# Patient Record
Sex: Male | Born: 1960 | Race: Black or African American | Hispanic: No | Marital: Married | Smoking: Current every day smoker
Health system: Southern US, Community
[De-identification: ages and names within clinical notes are randomized; demographics above are authoritative.]

## PROBLEM LIST (undated history)

## (undated) DIAGNOSIS — I10 Essential (primary) hypertension: Secondary | ICD-10-CM

## (undated) DIAGNOSIS — F209 Schizophrenia, unspecified: Secondary | ICD-10-CM

## (undated) DIAGNOSIS — E119 Type 2 diabetes mellitus without complications: Secondary | ICD-10-CM

## (undated) DIAGNOSIS — F319 Bipolar disorder, unspecified: Secondary | ICD-10-CM

---

## 2005-04-30 ENCOUNTER — Inpatient Hospital Stay (HOSPITAL_COMMUNITY): Admission: EM | Admit: 2005-04-30 | Discharge: 2005-05-05 | Payer: Self-pay | Admitting: Psychiatry

## 2005-04-30 ENCOUNTER — Ambulatory Visit: Payer: Self-pay | Admitting: Psychiatry

## 2010-04-26 ENCOUNTER — Other Ambulatory Visit: Payer: Self-pay | Admitting: Emergency Medicine

## 2010-04-26 ENCOUNTER — Ambulatory Visit: Payer: Self-pay | Admitting: Psychiatry

## 2010-04-27 ENCOUNTER — Inpatient Hospital Stay (HOSPITAL_COMMUNITY): Admission: AD | Admit: 2010-04-27 | Discharge: 2010-04-30 | Payer: Self-pay | Admitting: Psychiatry

## 2010-11-11 LAB — GLUCOSE, CAPILLARY: Glucose-Capillary: 160 mg/dL — ABNORMAL HIGH (ref 70–99)

## 2010-11-12 LAB — RAPID URINE DRUG SCREEN, HOSP PERFORMED
Amphetamines: NOT DETECTED
Benzodiazepines: NOT DETECTED
Cocaine: NOT DETECTED
Tetrahydrocannabinol: NOT DETECTED

## 2010-11-12 LAB — HEPATIC FUNCTION PANEL
Alkaline Phosphatase: 64 U/L (ref 39–117)
Indirect Bilirubin: 0.3 mg/dL (ref 0.3–0.9)
Total Protein: 6.1 g/dL (ref 6.0–8.3)

## 2010-11-12 LAB — GLUCOSE, CAPILLARY
Glucose-Capillary: 143 mg/dL — ABNORMAL HIGH (ref 70–99)
Glucose-Capillary: 227 mg/dL — ABNORMAL HIGH (ref 70–99)

## 2010-11-12 LAB — CARBAMAZEPINE LEVEL, TOTAL: Carbamazepine Lvl: 2.6 ug/mL — ABNORMAL LOW (ref 4.0–12.0)

## 2010-11-12 LAB — CBC
HCT: 32.5 % — ABNORMAL LOW (ref 39.0–52.0)
MCHC: 33 g/dL (ref 30.0–36.0)
RDW: 13.9 % (ref 11.5–15.5)

## 2010-11-12 LAB — POCT I-STAT, CHEM 8
BUN: 13 mg/dL (ref 6–23)
Creatinine, Ser: 1.2 mg/dL (ref 0.4–1.5)
Glucose, Bld: 97 mg/dL (ref 70–99)
Hemoglobin: 11.6 g/dL — ABNORMAL LOW (ref 13.0–17.0)
Potassium: 3.3 mEq/L — ABNORMAL LOW (ref 3.5–5.1)

## 2011-01-14 NOTE — Discharge Summary (Signed)
NAME:  [name], [name]              ACCOUNT NO.:  [id number]   MEDICAL RECORD NO.:  [id number]          PATIENT TYPE:  IPS   LOCATION:  0404                          FACILITY:  [facility]   PHYSICIAN:  Jeanice Lim, M.D. DATE OF BIRTH:  1960/10/17   DATE OF ADMISSION:  04/30/2005  DATE OF DISCHARGE:  05/05/2005                                 DISCHARGE SUMMARY   IDENTIFYING DATA:  This is a 50 year old African-American male, single,  involuntarily committed, reporting I just can't take it any more.  I can't  sit on that porch any more.  The patient admitted to using cocaine, had  been living with father since discharge from the hospital in May, started on  Risperdal Consta.  Haldol Decanoate was discontinued.  No oral Risperdal  continued.  Arguing with father, using cocaine, questionable alcohol,  feeling agitated and unsafe, feeling like tearing things up.  History of  agitation and property destruction.  Admits to noncompliance with  medications.   ADMISSION MEDICATIONS:  Prinivil, Seroquel, Haldol, Glucophage.   ALLERGIES:  No known drug allergies.   PHYSICAL AND NEUROLOGICAL EXAMINATION:  Within normal limits.   ROUTINE ADMISSION LABS:  Within normal limits.   MENTAL STATUS EXAM:  Fully alert, irritable, anxious, psychomotor agitated,  pressured speech, some decreased production.  Mood was irritable, guarded,  thought process disorganized, paranoia about being around people.  Vague  homicidal ideation and suicidal ideation without plan.  Cognitively grossly  intact, except not today.  Unreliable historian.  Poor judgment and insight.   ADMISSION DIAGNOSES:  AXIS I:  Schizophrenia, paranoid type.  AXIS II:  Deferred.  AXIS III:  Diabetes mellitus type 2, onychomycosis, hypertension by history.  AXIS IV:  Severe problems with stress related to death of mother in 08/27/2004 and housing problems, wants a group home.  AXIS V:  25/57   HOSPITAL COURSE:  The patient was  admitted and ordered routine p.r.n.  medications.  The patient was added on oral Risperdal and Librium detox was  ordered for possible withdrawal symptoms.  Alcohol level was 15.  CIWA was  monitored as well as routine labs and other medication medications were  resumed.  Risk/benefit ratio and alternative treatments were discussed.  The  patient showed gradual improvement in symptoms as he was stabilized on  medications.  Case management assisted him with his aftercare needs.  He was  discharged in improved condition with no overt psychotic symptoms and no  dangerous ideation.  Medication education was given, including discussing  risk/benefit ratio and alternative treatments and importance of compliance  with medications.  He was discharged on:  1.  Actos 30 mg in the morning.  2.  Lantus insulin 12 units subcutaneous q.h.s.  3.  Cogentin 1 mg b.i.d.  4.  Prinivil 10 mg q.a.m.  5.  Glucophage XR 500 mg two in the morning and two at 5 p.m.  6.  Risperdal 2 mg 1/2 in the morning and 1.5 at 8 p.m.  7.  Glucotrol 5 mg 2 in the morning and 2 at 6 p.m.   DISPOSITION:  The patient was to follow up at [facility] mental health  Thursday September 7 at 11 a.m.   DISCHARGE DIAGNOSES:  AXIS I:  Schizophrenia, paranoid type.  AXIS II:  Deferred.  AXIS III:  Diabetes mellitus type 2, onychomycosis, hypertension by history.  AXIS IV:  Severe problems with stress related to death of mother in August 17, 2004 and housing problems, wants a group home.  AXIS V:  Global assessment of function on discharge was 50-55.      Jeanice Lim, M.D.  Electronically Signed     JEM/MEDQ  D:  06/19/2005  T:  06/19/2005  Job:  [id number]

## 2011-01-14 NOTE — Consult Note (Signed)
NAME:  [name], [name]              ACCOUNT NO.:  [id number]   MEDICAL RECORD NO.:  [id number]          PATIENT TYPE:  IPS   LOCATION:  0403                          [facility]ACILITY:  [facility]   PHYSICIAN:  Corinna L. Lendell Caprice, MDDATE OF BIRTH:  [birth year]   DATE OF CONSULTATION:  05/03/2005  DATE OF DISCHARGE:                                   CONSULTATION   REASON FOR CONSULTATION:  Uncontrolled type 2 diabetes.   IMPRESSION/RECOMMENDATION:  1.  Diabetes:  Will add Actos which can be titrated upward over a few weeks.      I will also increase the sliding scale Humalog and add Lantus 12 units.      Ideally, the patient would be on insulin or metformin, or insulin alone      as an outpatient.  However, I doubt patient would be compliant with      insulin.  He reports that he never checks his sugars at home and has no      Glucometer.  He does admit to being on insulin in the past but he      stopped it on his own because (he) was tired of it.  I doubt perfect      control with the addition of Actos, but it should help somewhat.  2.  Controlled hypertension.  3.  Schizophrenia.   HISTORY OF PRESENT ILLNESS:  [name] is a 50 year old black male who was  admitted for schizophrenia.  He has diabetes and apparently has been  noncompliant with his medications.  His sugars have been in the 300s to 400s  despite being on sliding scale insulin, max dose metformin and glipizide.  The patient has no complaints currently.   PAST MEDICAL HISTORY:  As above.   MEDICATIONS:  1.  Nicotine patch.  2.  Multivitamin.  3.  Thiamine.  4.  Librium 25 mg a day.  5.  Cogentin 1 mg p.o. b.i.d.  6.  Lisinopril 10 mg p.o. q.d.  7.  Metformin 1000 mg p.o. b.i.d.  8.  Risperidone 25 mg IM every 14 days.  9.  Insulin sliding scale Humalog.  10. Risperdal 3 mg p.o. q.h.s.  11. Glucotrol 10 mg p.o. b.i.d.   SOCIAL HISTORY:  The patient smokes two packs a cigarettes a day and  apparently may use cocaine per  chart, although it is difficult to read the  writing.   FAMILY HISTORY:  Noncontributory.   REVIEW OF SYSTEMS:  As above, otherwise negative.   PHYSICAL EXAMINATION:  VITAL SIGNS:  Temperature 97.6, pulse 100,  respiratory rate 20, blood pressure 132/83.  GENERAL:  The patient is an overweight black male lying on the bed in no  acute distress.  HEENT:  Normocephalic and atraumatic.  Pupils are equal, round and reactive  to light.  Sclera nonicteric.  NECK:  Supple.  No lymphadenopathy.  LUNGS:  Clear to auscultation bilaterally without wheezes, rhonchi or rales.  CARDIOVASCULAR:  Regular rate and rhythm without murmurs, gallops or rubs.  ABDOMEN:  Obese, soft, nontender, nondistended.  GU:  Deferred.  RECTAL:  Deferred.  EXTREMITIES:  No clubbing, cyanosis, or edema.  SKIN:  No rash or ulcerations.  PSYCHIATRIC:  The patient is calm and cooperative.  NEUROLOGIC:  Alert and oriented.  Cranial nerves and sensory motor exam are  grossly intact.   LABORATORY DATA:  Complete metabolic panel is significant for glucose of 312  and albumin of 2.9, total protein of 5.5; otherwise normal.  CBC  unremarkable.  CBGs have been ranging in the high 200s, up to 400.  EKG  shows normal sinus rhythm, poor R wave progression and nonspecific changes.  His hemoglobin A1C was 8.   ASSESSMENT/PLAN:  As above.  My partners will follow and make further  recommendations if needed.      Corinna L. Lendell Caprice, MD  Electronically Signed     CLS/MEDQ  D:  05/03/2005  T:  05/04/2005  Job:  [id number]

## 2011-08-25 ENCOUNTER — Emergency Department: Payer: Self-pay | Admitting: Emergency Medicine

## 2011-10-11 LAB — COMPREHENSIVE METABOLIC PANEL
Albumin: 3.1 g/dL — ABNORMAL LOW (ref 3.4–5.0)
Anion Gap: 11 (ref 7–16)
Chloride: 105 mmol/L (ref 98–107)
Co2: 26 mmol/L (ref 21–32)
EGFR (African American): >60
EGFR (Non-African Amer.): 56 — ABNORMAL LOW
Glucose: 278 mg/dL — ABNORMAL HIGH (ref 65–99)
Osmolality: 293 (ref 275–301)
Potassium: 3.5 mmol/L (ref 3.5–5.1)
SGOT(AST): 23 U/L (ref 15–37)
SGPT (ALT): 25 U/L
Total Protein: 7.8 g/dL (ref 6.4–8.2)

## 2011-10-11 LAB — CBC
HGB: 13.4 g/dL (ref 13.0–18.0)
MCH: 28.9 pg (ref 26.0–34.0)
MCHC: 33.5 g/dL (ref 32.0–36.0)
MCV: 86 fL (ref 80–100)
Platelet: 226 10*3/uL (ref 150–440)
RBC: 4.65 10*6/uL (ref 4.40–5.90)
RDW: 13.8 % (ref 11.5–14.5)

## 2011-10-11 LAB — DRUG SCREEN, URINE
Barbiturates, Ur Screen: NEGATIVE (ref ?–200)
Cannabinoid 50 Ng, Ur ~~LOC~~: NEGATIVE (ref ?–50)
Cocaine Metabolite,Ur ~~LOC~~: NEGATIVE (ref ?–300)
Methadone, Ur Screen: NEGATIVE (ref ?–300)

## 2011-10-11 LAB — ETHANOL: Ethanol: <3 mg/dL

## 2011-10-11 LAB — TSH: Thyroid Stimulating Horm: 1.21 u[IU]/mL

## 2011-10-12 ENCOUNTER — Inpatient Hospital Stay: Payer: Self-pay | Admitting: Psychiatry

## 2011-10-13 LAB — URINALYSIS, COMPLETE
Bilirubin,UR: NEGATIVE
Ketone: NEGATIVE
Leukocyte Esterase: NEGATIVE
Protein: 100
RBC,UR: NONE SEEN /HPF (ref 0–5)
Specific Gravity: 1 (ref 1.003–1.030)
Squamous Epithelial: NONE SEEN
WBC UR: <1 /HPF (ref 0–5)

## 2011-10-13 LAB — BEHAVIORAL MEDICINE 1 PANEL
Albumin: 2.9 g/dL — ABNORMAL LOW (ref 3.4–5.0)
Alkaline Phosphatase: 55 U/L (ref 50–136)
Anion Gap: 10 (ref 7–16)
BUN: 10 mg/dL (ref 7–18)
Basophil #: 0 10*3/uL (ref 0.0–0.1)
Calcium, Total: 8.4 mg/dL — ABNORMAL LOW (ref 8.5–10.1)
Chloride: 96 mmol/L — ABNORMAL LOW (ref 98–107)
Creatinine: 1.43 mg/dL — ABNORMAL HIGH (ref 0.60–1.30)
Eosinophil %: 2.9 %
Glucose: 190 mg/dL — ABNORMAL HIGH (ref 65–99)
HCT: 36.6 % — ABNORMAL LOW (ref 40.0–52.0)
HGB: 12.3 g/dL — ABNORMAL LOW (ref 13.0–18.0)
Lymphocyte #: 1.1 10*3/uL (ref 1.0–3.6)
Lymphocyte %: 15.2 %
MCV: 85 fL (ref 80–100)
Monocyte %: 11.5 %
Osmolality: 261 (ref 275–301)
Potassium: 3.7 mmol/L (ref 3.5–5.1)
RBC: 4.29 10*6/uL — ABNORMAL LOW (ref 4.40–5.90)
Sodium: 128 mmol/L — ABNORMAL LOW (ref 136–145)
Thyroid Stimulating Horm: 1.01 u[IU]/mL
Total Protein: 6.9 g/dL (ref 6.4–8.2)
WBC: 7.2 10*3/uL (ref 3.8–10.6)

## 2011-10-14 LAB — COMPREHENSIVE METABOLIC PANEL
Albumin: 2.6 g/dL — ABNORMAL LOW (ref 3.4–5.0)
Alkaline Phosphatase: 55 U/L (ref 50–136)
Anion Gap: 6 — ABNORMAL LOW (ref 7–16)
Bilirubin,Total: 0.5 mg/dL (ref 0.2–1.0)
Calcium, Total: 8.7 mg/dL (ref 8.5–10.1)
Chloride: 106 mmol/L (ref 98–107)
Creatinine: 1.46 mg/dL — ABNORMAL HIGH (ref 0.60–1.30)
Glucose: 216 mg/dL — ABNORMAL HIGH (ref 65–99)
Osmolality: 281 (ref 275–301)
Potassium: 4 mmol/L (ref 3.5–5.1)
Sodium: 138 mmol/L (ref 136–145)

## 2011-11-05 ENCOUNTER — Emergency Department: Payer: Self-pay | Admitting: Emergency Medicine

## 2011-11-05 LAB — SALICYLATE LEVEL: Salicylates, Serum: 2 mg/dL

## 2011-11-05 LAB — ETHANOL
Ethanol %: <0.003 % (ref 0.000–0.080)
Ethanol: <3 mg/dL

## 2011-11-05 LAB — URINALYSIS, COMPLETE
Ph: 5 (ref 4.5–8.0)
Protein: 100
RBC,UR: 1 /HPF (ref 0–5)

## 2011-11-05 LAB — COMPREHENSIVE METABOLIC PANEL
Albumin: 3.6 g/dL (ref 3.4–5.0)
BUN: 12 mg/dL (ref 7–18)
Bilirubin,Total: 1 mg/dL (ref 0.2–1.0)
Chloride: 105 mmol/L (ref 98–107)
Co2: 24 mmol/L (ref 21–32)
Creatinine: 1.38 mg/dL — ABNORMAL HIGH (ref 0.60–1.30)
EGFR (Non-African Amer.): 58 — ABNORMAL LOW
Osmolality: 280 (ref 275–301)
Potassium: 3.5 mmol/L (ref 3.5–5.1)
SGOT(AST): 140 U/L — ABNORMAL HIGH (ref 15–37)
Sodium: 140 mmol/L (ref 136–145)
Total Protein: 7.5 g/dL (ref 6.4–8.2)

## 2011-11-05 LAB — CBC
HCT: 36.4 % — ABNORMAL LOW (ref 40.0–52.0)
MCH: 29.6 pg (ref 26.0–34.0)
MCV: 87 fL (ref 80–100)
RDW: 13.9 % (ref 11.5–14.5)
WBC: 8.9 10*3/uL (ref 3.8–10.6)

## 2011-11-05 LAB — DRUG SCREEN, URINE
Barbiturates, Ur Screen: NEGATIVE (ref ?–200)
Benzodiazepine, Ur Scrn: NEGATIVE (ref ?–200)
Cannabinoid 50 Ng, Ur ~~LOC~~: NEGATIVE (ref ?–50)
Methadone, Ur Screen: NEGATIVE (ref ?–300)
Opiate, Ur Screen: NEGATIVE (ref ?–300)
Tricyclic, Ur Screen: NEGATIVE (ref ?–1000)

## 2011-11-05 LAB — ACETAMINOPHEN LEVEL: Acetaminophen: <2 ug/mL

## 2011-11-14 LAB — VALPROIC ACID LEVEL: Valproic Acid: 67 ug/mL

## 2014-06-19 ENCOUNTER — Emergency Department (HOSPITAL_COMMUNITY)
Admission: EM | Admit: 2014-06-19 | Discharge: 2014-06-21 | Disposition: A | Discharge status: Home / Self Care | Payer: Medicare Other | Attending: Emergency Medicine | Admitting: Emergency Medicine

## 2014-06-19 ENCOUNTER — Encounter (HOSPITAL_COMMUNITY): Payer: Self-pay | Admitting: Emergency Medicine

## 2014-06-19 DIAGNOSIS — F259 Schizoaffective disorder, unspecified: Secondary | ICD-10-CM | POA: Diagnosis not present

## 2014-06-19 DIAGNOSIS — Z046 Encounter for general psychiatric examination, requested by authority: Secondary | ICD-10-CM | POA: Diagnosis present

## 2014-06-19 DIAGNOSIS — N289 Disorder of kidney and ureter, unspecified: Secondary | ICD-10-CM | POA: Diagnosis not present

## 2014-06-19 DIAGNOSIS — I10 Essential (primary) hypertension: Secondary | ICD-10-CM | POA: Diagnosis not present

## 2014-06-19 HISTORY — DX: Essential (primary) hypertension: I10

## 2014-06-19 HISTORY — DX: Schizophrenia, unspecified: F20.9

## 2014-06-19 HISTORY — DX: Bipolar disorder, unspecified: F31.9

## 2014-06-19 NOTE — ED Notes (Signed)
Writer proceeded to draw blood work but per [employer] pt sts "he does not like any females touching him".

## 2014-06-19 NOTE — ED Notes (Signed)
Patient escorted to room by [employer] in handcuffs. Verbally aggressive to all staff and [employer]. Patient able to speak full, clear sentences with profuse profanity. Denies pain. Moving all extremities equally. All belongings at nurses' station and wanded by security. [employer] maintains at bedside. No other questions/concerns by patient.

## 2014-06-19 NOTE — ED Provider Notes (Signed)
CSN: [id number]     Arrival date & time 06/19/14  2150 History   First MD Initiated Contact with Patient 06/19/14 2246     Chief Complaint  Patient presents with  . IVC      (Consider location/radiation/quality/duration/timing/severity/associated sxs/prior Treatment) HPI Comments: Patient was brought in by police for IVC. Very limited history is present at this time however patient has a history of schizophrenia and today had a violent episode where he destroyed property where he lives. When asking questions here unable to understand what pt is saying.  The history is provided by the police. The history is limited by the absence of a caregiver and the condition of the patient.    Past Medical History  Diagnosis Date  . Bipolar 1 disorder   . Schizophrenia   . Hypertension    History reviewed. No pertinent past surgical history. History reviewed. No pertinent family history. History  Substance Use Topics  . Smoking status: Never Smoker   . Smokeless tobacco: Not on file  . Alcohol Use: No    Review of Systems  Unable to perform ROS     Allergies  Review of patient's allergies indicates no known allergies.  Home Medications   Prior to Admission medications   Not on File   BP 173/88  Pulse 92  Temp(Src) 98.2 F (36.8 C) (Oral)  Resp 18  SpO2 97% Physical Exam  Nursing note and vitals reviewed. Constitutional: He is oriented to person, place, and time. He appears well-developed and well-nourished. No distress.  Pt laying on the stretcher cuffed to the bed  HENT:  Head: Normocephalic and atraumatic.  Mouth/Throat: Oropharynx is clear and moist.  Eyes: Conjunctivae and EOM are normal. Pupils are equal, round, and reactive to light.  Neck: Normal range of motion. Neck supple.  Cardiovascular: Normal rate.   Pulmonary/Chest: Effort normal.  Musculoskeletal: Normal range of motion. He exhibits no edema and no tenderness.  Neurological: He is alert and oriented to  person, place, and time.  Skin: Skin is warm and dry. No rash noted. No erythema.  Psychiatric:  Unable to hear what pt is saying.  Unclear if he is hallucinating    ED Course  Procedures (including critical care time) Labs Review Labs Reviewed  CBG MONITORING, ED    Imaging Review No results found.   EKG Interpretation None      MDM   Final diagnoses:  None    Patient is mumbling incoherently and based on police report he has a history of schizophrenia and is having a psychotic episode where he destroyed property where he lives. When I try to speed up the patient I cannot understand what he is saying when I ask if I can examine the patient he says no one is touching him or examining his heart. Patient's vital signs are stable.  Medical screening labs pending   Gwyneth SproutWhitney Kalecia Hartney, MD 06/19/14 [phone]

## 2014-06-20 DIAGNOSIS — F259 Schizoaffective disorder, unspecified: Secondary | ICD-10-CM | POA: Diagnosis present

## 2014-06-20 LAB — CBC WITH DIFFERENTIAL/PLATELET
Basophils Absolute: 0.1 10*3/uL (ref 0.0–0.1)
Basophils Relative: 1 % (ref 0–1)
EOS ABS: 0.2 10*3/uL (ref 0.0–0.7)
EOS PCT: 3 % (ref 0–5)
HEMATOCRIT: 30.7 % — AB (ref 39.0–52.0)
HEMOGLOBIN: 10.2 g/dL — AB (ref 13.0–17.0)
LYMPHS ABS: 1.6 10*3/uL (ref 0.7–4.0)
Lymphocytes Relative: 25 % (ref 12–46)
MCH: 26.2 pg (ref 26.0–34.0)
MCHC: 33.2 g/dL (ref 30.0–36.0)
MCV: 78.7 fL (ref 78.0–100.0)
MONO ABS: 0.8 10*3/uL (ref 0.1–1.0)
MONOS PCT: 12 % (ref 3–12)
Neutro Abs: 3.9 10*3/uL (ref 1.7–7.7)
Neutrophils Relative %: 59 % (ref 43–77)
Platelets: 190 10*3/uL (ref 150–400)
RBC: 3.9 MIL/uL — AB (ref 4.22–5.81)
RDW: 13.3 % (ref 11.5–15.5)
WBC: 6.6 10*3/uL (ref 4.0–10.5)

## 2014-06-20 LAB — I-STAT CHEM 8, ED
BUN: 19 mg/dL (ref 6–23)
CREATININE: 2.9 mg/dL — AB (ref 0.50–1.35)
Calcium, Ion: 1.12 mmol/L (ref 1.12–1.23)
Chloride: 107 mEq/L (ref 96–112)
GLUCOSE: 137 mg/dL — AB (ref 70–99)
HCT: 33 % — ABNORMAL LOW (ref 39.0–52.0)
Hemoglobin: 11.2 g/dL — ABNORMAL LOW (ref 13.0–17.0)
POTASSIUM: 3.3 meq/L — AB (ref 3.7–5.3)
SODIUM: 139 meq/L (ref 137–147)
TCO2: 20 mmol/L (ref 0–100)

## 2014-06-20 LAB — SALICYLATE LEVEL

## 2014-06-20 LAB — ETHANOL: Alcohol, Ethyl (B): <11 mg/dL (ref 0–11)

## 2014-06-20 LAB — ACETAMINOPHEN LEVEL

## 2014-06-20 MED ORDER — HYDROCHLOROTHIAZIDE 12.5 MG PO CAPS
25.0000 mg | ORAL_CAPSULE | Freq: Every day | ORAL | Status: DC
  Filled 2014-06-20: qty 2

## 2014-06-20 MED ORDER — POTASSIUM CHLORIDE CRYS ER 20 MEQ PO TBCR
10.0000 meq | EXTENDED_RELEASE_TABLET | Freq: Every day | ORAL | Status: DC
  Administered 2014-06-20: 10 meq via ORAL
  Filled 2014-06-20: qty 0.5

## 2014-06-20 MED ORDER — QUETIAPINE FUMARATE 100 MG PO TABS
100.0000 mg | ORAL_TABLET | Freq: Every day | ORAL | Status: DC
  Administered 2014-06-20: 100 mg via ORAL
  Filled 2014-06-20: qty 1

## 2014-06-20 MED ORDER — ZIPRASIDONE MESYLATE 20 MG IM SOLR
20.0000 mg | Freq: Once | INTRAMUSCULAR | Status: AC
  Administered 2014-06-20: 20 mg via INTRAMUSCULAR
  Filled 2014-06-20: qty 20

## 2014-06-20 MED ORDER — HYDROCHLOROTHIAZIDE 25 MG PO TABS
25.0000 mg | ORAL_TABLET | Freq: Every day | ORAL | Status: DC
  Administered 2014-06-20 – 2014-06-21 (×2): 25 mg via ORAL
  Filled 2014-06-20 (×2): qty 1

## 2014-06-20 MED ORDER — LORAZEPAM 2 MG/ML IJ SOLN
2.0000 mg | Freq: Once | INTRAMUSCULAR | Status: AC
  Administered 2014-06-20: 2 mg via INTRAMUSCULAR
  Filled 2014-06-20: qty 1

## 2014-06-20 MED ORDER — MAGNESIUM CITRATE PO SOLN
1.0000 | Freq: Once | ORAL | Status: AC
  Administered 2014-06-20: 1 via ORAL
  Filled 2014-06-20: qty 296

## 2014-06-20 MED ORDER — QUETIAPINE FUMARATE 50 MG PO TABS
50.0000 mg | ORAL_TABLET | Freq: Three times a day (TID) | ORAL | Status: DC
  Administered 2014-06-20 – 2014-06-21 (×4): 50 mg via ORAL
  Filled 2014-06-20 (×5): qty 1

## 2014-06-20 MED ORDER — DIPHENHYDRAMINE HCL 50 MG/ML IJ SOLN
50.0000 mg | Freq: Once | INTRAMUSCULAR | Status: AC
  Administered 2014-06-20: 50 mg via INTRAMUSCULAR
  Filled 2014-06-20: qty 1

## 2014-06-20 NOTE — ED Notes (Signed)
[facility] [employer], presents for medical clearance.  Pt IVC ed by caregiver.  Pt physically and verbally aggressive toward other in home.  Pt is hearing voices and responding to them.  Pt cursing obscenities at [facility] ED staff.  Denies SI, pt poor historian, unable to obtain history from pt.

## 2014-06-20 NOTE — Consult Note (Signed)
[facility]   Reason for Consult:  Psychosis  Referring Physician:  EDP  [name] is an 53 y.o. male. Total Time spent with patient: 45 minutes  Assessment: AXIS I:  Schizoaffective Disorder AXIS II:  Deferred AXIS III:   Past Medical History  Diagnosis Date  . Bipolar 1 disorder   . Schizophrenia   . Hypertension    AXIS IV:  other psychosocial or environmental problems, problems related to social environment and problems with primary support group AXIS V:  21-30 behavior considerably influenced by delusions or hallucinations OR serious impairment in judgment, communication OR inability to function in almost all areas  Plan:  Recommend psychiatric Inpatient admission when medically cleared.  Subjective:   [name] is a 53 y.o. male patient admitted with psychosis.  HPI:  Patient came to the ED from his group home after hallucinating and being aggressive.  He was cursing to the ED staff and threatening last night on arrival, PRN medications given.  Today, he is clearly responding to internal stimuli but denies it.  Irritable on exam and forwards little information, three attempts to try and get more information.  His group home was called but no return calls.  Unsure of his medications. HPI Elements:   Location:  generalized acute. Quality:  acute. Severity:  severe. Timing:  constant. Duration:  few days. Context:  stressors.  Past Psychiatric History: Past Medical History  Diagnosis Date  . Bipolar 1 disorder   . Schizophrenia   . Hypertension     reports that he has never smoked. He does not have any smokeless tobacco history on file. He reports that he does not drink alcohol. His drug history is not on file. History reviewed. No pertinent family history.         Allergies:  No Known Allergies  ACT Assessment Complete:  Yes:    Educational Status    Risk to Self: Risk to self with the past 6 months Is patient at risk for  suicide?: No Substance abuse history and/or treatment for substance abuse?: No  Risk to Others:    Abuse:    Prior Inpatient Therapy:    Prior Outpatient Therapy:    Additional Information:                    Objective: Blood pressure 152/103, pulse 100, temperature 98.2 F (36.8 C), temperature source Oral, resp. rate 20, SpO2 100.00%.There is no height or weight on file to calculate BMI. Results for orders placed during the hospital encounter of 06/19/14 (from the past 72 hour(s))  CBC WITH DIFFERENTIAL     Status: Abnormal   Collection Time    06/19/14 11:50 PM      Result Value Ref Range   WBC 6.6  4.0 - 10.5 K/uL   RBC 3.90 (*) 4.22 - 5.81 MIL/uL   Hemoglobin 10.2 (*) 13.0 - 17.0 g/dL   HCT 16.130.7 (*) 09.639.0 - 04.552.0 %   MCV 78.7  78.0 - 100.0 fL   MCH 26.2  26.0 - 34.0 pg   MCHC 33.2  30.0 - 36.0 g/dL   RDW 40.913.3  81.111.5 - 91.415.5 %   Platelets 190  150 - 400 K/uL   Neutrophils Relative % 59  43 - 77 %   Neutro Abs 3.9  1.7 - 7.7 K/uL   Lymphocytes Relative 25  12 - 46 %   Lymphs Abs 1.6  0.7 - 4.0 K/uL   Monocytes Relative  12  3 - 12 %   Monocytes Absolute 0.8  0.1 - 1.0 K/uL   Eosinophils Relative 3  0 - 5 %   Eosinophils Absolute 0.2  0.0 - 0.7 K/uL   Basophils Relative 1  0 - 1 %   Basophils Absolute 0.1  0.0 - 0.1 K/uL  ETHANOL     Status: None   Collection Time    06/19/14 11:50 PM      Result Value Ref Range   Alcohol, Ethyl (B) <11  0 - 11 mg/dL   Comment:            LOWEST DETECTABLE LIMIT FOR     SERUM ALCOHOL IS 11 mg/dL     FOR MEDICAL PURPOSES ONLY  ACETAMINOPHEN LEVEL     Status: None   Collection Time    06/19/14 11:50 PM      Result Value Ref Range   Acetaminophen (Tylenol), Serum <15.0  10 - 30 ug/mL   Comment:            THERAPEUTIC CONCENTRATIONS VARY     SIGNIFICANTLY. A RANGE OF 10-30     ug/mL MAY BE AN EFFECTIVE     CONCENTRATION FOR MANY PATIENTS.     HOWEVER, SOME ARE BEST TREATED     AT CONCENTRATIONS OUTSIDE THIS     RANGE.      ACETAMINOPHEN CONCENTRATIONS     >150 ug/mL AT 4 HOURS AFTER     INGESTION AND >50 ug/mL AT 12     HOURS AFTER INGESTION ARE     OFTEN ASSOCIATED WITH TOXIC     REACTIONS.  SALICYLATE LEVEL     Status: Abnormal   Collection Time    06/19/14 11:50 PM      Result Value Ref Range   Salicylate Lvl <2.0 (*) 2.8 - 20.0 mg/dL  I-STAT CHEM 8, ED     Status: Abnormal   Collection Time    06/20/14 12:00 AM      Result Value Ref Range   Sodium 139  137 - 147 mEq/L   Potassium 3.3 (*) 3.7 - 5.3 mEq/L   Chloride 107  96 - 112 mEq/L   BUN 19  6 - 23 mg/dL   Creatinine, Ser 2.132.90 (*) 0.50 - 1.35 mg/dL   Glucose, Bld 086137 (*) 70 - 99 mg/dL   Calcium, Ion 5.781.12  4.691.12 - 1.23 mmol/L   TCO2 20  0 - 100 mmol/L   Hemoglobin 11.2 (*) 13.0 - 17.0 g/dL   HCT 62.933.0 (*) 52.839.0 - 41.352.0 %   Labs are reviewed and are pertinent for no medical issues noted.  Current Facility-Administered Medications  Medication Dose Route Frequency Provider Last Rate Last Dose  . hydrochlorothiazide (HYDRODIURIL) tablet 25 mg  25 mg Oral Daily [name], NP   25 mg at 06/20/14 1403  . potassium chloride SA (K-DUR,KLOR-CON) CR tablet 10 mEq  10 mEq Oral Daily [name], NP   10 mEq at 06/20/14 1403   No current outpatient prescriptions on file.    Psychiatric Specialty Exam:     Blood pressure 152/103, pulse 100, temperature 98.2 F (36.8 C), temperature source Oral, resp. rate 20, SpO2 100.00%.There is no height or weight on file to calculate BMI.  General Appearance: Casual  Eye Contact::  Good  Speech:  Normal Rate  Volume:  Normal  Mood:  Angry and Irritable  Affect:  Flat  Thought Process:  Coherent  Orientation:  Full (Time,  Place, and Person)  Thought Content:  Hallucinations: Auditory Visual  Suicidal Thoughts:  No  Homicidal Thoughts:  No  Memory:  Immediate;   Fair Recent;   Fair Remote;   Fair  Judgement:  Impaired  Insight:  Lacking  Psychomotor Activity:  Normal  Concentration:  Fair   Recall:  [employer] of Knowledge:Fair  Language: Fair  Akathisia:  No  Handed:  Right  AIMS (if indicated):     Assets:  Health and safety inspector Housing Leisure Time Physical Health Resilience  Sleep:      Musculoskeletal: Strength & Muscle Tone: within normal limits Gait & Station: normal Patient leans: N/A  Treatment Plan Summary: Daily contact with patient to assess and evaluate symptoms and progress in treatment Medication management; antipsychotic medication started, admit to inpatient psychiatric unit for stabilization.  Nanine Means, PMH-NP 06/20/2014 5:29 PM  Patient seen, evaluated and I agree with notes by Nurse Practitioner. Thedore Mins, MD

## 2014-06-20 NOTE — ED Notes (Signed)
[facility]. Pt. Alert and oriented in no distress denies SI, HI, AVH and pain. Will continue to monitor for safety. Pt. Instructed to come to me with problems or concerns. Q 15 minute checks continue.

## 2014-06-20 NOTE — ED Notes (Signed)
Pt mumbling, incoherent. " There's stuff in the backyard, I don't know." Pt c/o of constipation and requested prune juice. Pt was inc of urine. Showered with encouragement.

## 2014-06-20 NOTE — ED Notes (Signed)
Pt is hearing voices, appears preoccupied. Up at desk frequently, speech is unclear and garb. In and out of bathroom c/o constipation . Citrtae of mag given

## 2014-06-20 NOTE — ED Notes (Signed)
Report to BeninLatricia, Charity fundraiserN.  [facility]

## 2014-06-20 NOTE — Progress Notes (Signed)
  CARE MANAGEMENT ED NOTE 06/20/2014  Patient:  [name],[name]   Account Number:  [id number]  Date Initiated:  06/20/2014  Documentation initiated by:  Edd ArbourGIBBS,KIMBERLY  Subjective/Objective Assessment:   [age] y rold old medicare pt [place] resident came in with [employer] in handcuffs. Verbally aggressive to all staff and [employer]. Patient able to speak full, clear sentences with profuse profanity. Denies pain. Moving all extremities equally.     Subjective/Objective Assessment Detail:   poor historian  Unable to assess pcp at this time  [name] not found as a provider in EPIC     Action/Plan:   noted no pcp listed spoke with pt who mumbled incoherent Reports "[name]" "[name]" and then stated incoherent information   Action/Plan Detail:   Anticipated DC Date:       Status Recommendation to Physician:   Result of Recommendation:    Other ED Services  Consult Working Plan    DC Planning Services  Other  Outpatient Services - Pt will follow up  PCP issues    Choice offered to / List presented to:            Status of service:  Completed, signed off  ED Comments:   ED Comments Detail:

## 2014-06-20 NOTE — ED Notes (Signed)
Pt moved from soiled bed to floor.  Pt incontinent of urine, sonorous respirations, responsive to loud verbal stimuli and sternal rub.  Skin color good, Security and [employer] at bedside to assist with placing pt back on stretcher.  Comfort measures given, HOB elevated, siderails up. Vital signs-WNL.

## 2014-06-21 ENCOUNTER — Encounter (HOSPITAL_COMMUNITY): Payer: Self-pay | Admitting: Psychiatry

## 2014-06-21 DIAGNOSIS — F259 Schizoaffective disorder, unspecified: Secondary | ICD-10-CM | POA: Diagnosis not present

## 2014-06-21 LAB — RAPID URINE DRUG SCREEN, HOSP PERFORMED
AMPHETAMINES: NOT DETECTED
BARBITURATES: NOT DETECTED
Benzodiazepines: NOT DETECTED
Cocaine: NOT DETECTED
OPIATES: NOT DETECTED
TETRAHYDROCANNABINOL: NOT DETECTED

## 2014-06-21 LAB — I-STAT CHEM 8, ED
BUN: 22 mg/dL (ref 6–23)
CREATININE: 2.5 mg/dL — AB (ref 0.50–1.35)
Calcium, Ion: 1.05 mmol/L — ABNORMAL LOW (ref 1.12–1.23)
Chloride: 102 mEq/L (ref 96–112)
Glucose, Bld: 268 mg/dL — ABNORMAL HIGH (ref 70–99)
HCT: 32 % — ABNORMAL LOW (ref 39.0–52.0)
Hemoglobin: 10.9 g/dL — ABNORMAL LOW (ref 13.0–17.0)
Potassium: 4.2 mEq/L (ref 3.7–5.3)
Sodium: 136 mEq/L — ABNORMAL LOW (ref 137–147)
TCO2: 21 mmol/L (ref 0–100)

## 2014-06-21 LAB — CBG MONITORING, ED
Glucose-Capillary: 135 mg/dL — ABNORMAL HIGH (ref 70–99)
Glucose-Capillary: 249 mg/dL — ABNORMAL HIGH (ref 70–99)
Glucose-Capillary: 234 mg/dL — ABNORMAL HIGH (ref 70–99)

## 2014-06-21 MED ORDER — INSULIN ASPART 100 UNIT/ML ~~LOC~~ SOLN
10.0000 [IU] | Freq: Once | SUBCUTANEOUS | Status: AC
  Administered 2014-06-21: 10 [IU] via SUBCUTANEOUS
  Filled 2014-06-21: qty 1

## 2014-06-21 MED ORDER — POTASSIUM CHLORIDE CRYS ER 10 MEQ PO TBCR: 10.0000 meq | EXTENDED_RELEASE_TABLET | Freq: Every day | ORAL | Status: DC

## 2014-06-21 MED ORDER — HYDROCHLOROTHIAZIDE 25 MG PO TABS: 25.0000 mg | ORAL_TABLET | Freq: Every day | ORAL | Status: DC

## 2014-06-21 MED ORDER — ACETAMINOPHEN 325 MG PO TABS
650.0000 mg | ORAL_TABLET | Freq: Four times a day (QID) | ORAL | Status: DC | PRN
  Administered 2014-06-21: 650 mg via ORAL
  Filled 2014-06-21: qty 2

## 2014-06-21 MED ORDER — INSULIN ASPART 100 UNIT/ML ~~LOC~~ SOLN
8.0000 [IU] | Freq: Once | SUBCUTANEOUS | Status: AC
  Administered 2014-06-21: 8 [IU] via SUBCUTANEOUS
  Filled 2014-06-21: qty 1

## 2014-06-21 MED ORDER — QUETIAPINE FUMARATE 100 MG PO TABS: 100.0000 mg | ORAL_TABLET | Freq: Every day | ORAL | Status: DC

## 2014-06-21 MED ORDER — LISINOPRIL 20 MG PO TABS
20.0000 mg | ORAL_TABLET | Freq: Every day | ORAL | Status: DC
  Administered 2014-06-21: 20 mg via ORAL
  Filled 2014-06-21: qty 1

## 2014-06-21 MED ORDER — QUETIAPINE FUMARATE 50 MG PO TABS: 50.0000 mg | ORAL_TABLET | Freq: Three times a day (TID) | ORAL | Status: DC

## 2014-06-21 MED ORDER — LORAZEPAM 1 MG PO TABS: 1.0000 mg | ORAL_TABLET | Freq: Four times a day (QID) | ORAL | Status: DC | PRN

## 2014-06-21 MED ORDER — ACETAMINOPHEN 325 MG PO TABS: 650.0000 mg | ORAL_TABLET | Freq: Once | ORAL | Status: DC

## 2014-06-21 MED ORDER — LISINOPRIL 40 MG PO TABS: 40.0000 mg | ORAL_TABLET | Freq: Every day | ORAL | Status: DC

## 2014-06-21 NOTE — ED Notes (Signed)
Pt sleeping in activity room, nad.  Pt is aware that he will be dc'd home later today.

## 2014-06-21 NOTE — ED Notes (Signed)
Sandwich and soda given.

## 2014-06-21 NOTE — Discharge Instructions (Signed)
Your creatinine (a measure of your kidney function) was elevated and needs to be rechecked by a primary care doctor.   Schizophrenia Schizophrenia is a mental illness. It may cause disturbed or disorganized thinking, speech, or behavior. People with schizophrenia have problems functioning in one or more areas of life: work, school, home, or relationships. People with schizophrenia are at increased risk for suicide, certain chronic physical illnesses, and unhealthy behaviors, such as smoking and drug use. People who have family members with schizophrenia are at higher risk of developing the illness. Schizophrenia affects men and women equally but usually appears at an earlier age (teenage or early adult years) in men.  SYMPTOMS The earliest symptoms are often subtle (prodrome) and may go unnoticed until the illness becomes more severe (first-break psychosis). Symptoms of schizophrenia may be continuous or may come and go in severity. Episodes often are triggered by major life events, such as family stress, college, [employer] service, marriage, pregnancy or child birth, divorce, or loss of a loved one. People with schizophrenia may see, hear, or feel things that do not exist (hallucinations). They may have false beliefs in spite of obvious proof to the contrary (delusions). Sometimes speech is incoherent or behavior is odd or withdrawn.  DIAGNOSIS Schizophrenia is diagnosed through an assessment by your caregiver. Your caregiver will ask questions about your thoughts, behavior, mood, and ability to function in daily life. Your caregiver may ask questions about your medical history and use of alcohol or drugs, including prescription medication. Your caregiver may also order blood tests and imaging exams. Certain medical conditions and substances can cause symptoms that resemble schizophrenia. Your caregiver may refer you to a mental health specialist for evaluation. There are three major criterion for a  diagnosis of schizophrenia:  Two or more of the following five symptoms are present for a month or longer:  Delusions. Often the delusions are that you are being attacked, harassed, cheated, persecuted or conspired against (persecutory delusions).  Hallucinations.   Disorganized speech that does not make sense to others.  Grossly disorganized (confused or unfocused) behavior or extremely overactive or underactive motor activity (catatonia).  Negative symptoms such as bland or blunted emotions (flat affect), loss of will power (avolition), and withdrawal from social contacts (social isolation).  Level of functioning in one or more major areas of life (work, school, relationships, or self-care) is markedly below the level of functioning before the onset of illness.   There are continuous signs of illness (either mild symptoms or decreased level of functioning) for at least 6 months or longer. TREATMENT  Schizophrenia is a long-term illness. It is best controlled with continuous treatment rather than treatment only when symptoms occur. The following treatments are used to manage schizophrenia:  Medication--Medication is the most effective and important form of treatment for schizophrenia. Antipsychotic medications are usually prescribed to help manage schizophrenia. Other types of medication may be added to relieve any symptoms that may occur despite the use of antipsychotic medications.  Counseling or talk therapy--Individual, group, or family counseling may be helpful in providing education, support, and guidance. Many people with schizophrenia also benefit from social skills and job skills (vocational) training. A combination of medication and counseling is best for managing the disorder over time. A procedure in which electricity is applied to the brain through the scalp (electroconvulsive therapy) may be used to treat catatonic schizophrenia or schizophrenia in people who cannot take or do  not respond to medication and counseling. Document Released: 08/12/2000 Document Revised:  04/17/2013 Document Reviewed: 11/07/2012 ExitCare Patient Information 2015 [place], [place]. This information is not intended to replace advice given to you by your health care provider. Make sure you discuss any questions you have with your health care provider.  Kidney Failure Kidney failure happens when the kidneys cannot remove waste and excess fluid that naturally builds up in your blood after your body breaks down food. This leads to a dangerous buildup of waste products and fluid in the blood. HOME CARE  Follow your diet as told by your doctor.  Take all medicines as told by your doctor.  Keep all of your dialysis appointments. Call if you are unable to keep an appointment. GET HELP RIGHT AWAY IF:   You make a lot more or very little pee (urine).  Your face or ankles puff up (swell).  You develop shortness of breath.  You develop weakness, feel tired, or you do not feel hungry (appetite loss).  You feel poorly for no known reason. MAKE SURE YOU:   Understand these instructions.  Will watch your condition.  Will get help right away if you are not doing well or get worse. Document Released: 11/09/2009 Document Revised: 11/07/2011 Document Reviewed: 12/16/2009 [facility] Patient Information 2015 [place], [place]. This information is not intended to replace advice given to you by your health care provider. Make sure you discuss any questions you have with your health care provider.  Discharge Medication Instructions: Discontinue Cogentin, Haldol, and Risperdal from his home medication list until he is [facility].   Take Seroquel 50 mg by mouth at 8 am, 1 pm, and 6 pm for schizoaffective disorder; take Seroquel 100 mg at bedtime for sleep and mood stability Take Potassium 10 mEq by mouth daily for hypokalemia Take Ativan 1 mg every six hours by mouth as  needed for anxiety/agitation

## 2014-06-21 NOTE — ED Notes (Addendum)
In the day room, pharm tech into see.  Pt reports that he does not know the name of the pharmacy he gets his medications from, or what medicines he takes "silly".  Pt also reports that he does not know the name of the group home or where it is.  Pt calm, redirectable.

## 2014-06-21 NOTE — [facility] Suicide Risk Assessment (Signed)
Suicide Risk Assessment  Discharge Assessment     Demographic Factors:  Male  Total Time spent with patient: 45 minutes  Psychiatric Specialty Exam:     Blood pressure 158/98, pulse 84, temperature 97.6 F (36.4 C), temperature source Oral, resp. rate 18, SpO2 100.00%.There is no height or weight on file to calculate BMI.  General Appearance: Casual  Eye Contact::  Good  Speech:  Normal Rate  Volume:  Normal  Mood:  Euthymic  Affect:  Congruent  Thought Process:  Coherent  Orientation:  Full (Time, Place, and Person)  Thought Content:  WDL  Suicidal Thoughts:  No  Homicidal Thoughts:  No  Memory:  Immediate;   Good Recent;   Good Remote;   Good  Judgement:  Fair  Insight:  Fair  Psychomotor Activity:  Normal  Concentration:  Good  Recall:  Good  Fund of Knowledge:Fair  Language: Fair  Akathisia:  No  Handed:  Right  AIMS (if indicated):     Assets:  Health and safety inspectorinancial Resources/Insurance Housing Leisure Time Physical Health Resilience Social Support  Sleep:      Musculoskeletal: Strength & Muscle Tone: within normal limits Gait & Station: normal Patient leans: N/A  Mental Status Per Nursing Assessment::   On Admission:   Agitation, hallucinations  Current Mental Status by Physician: NA  Loss Factors: NA  Historical Factors: NA  Risk Reduction Factors:   Living with another person, especially a relative, Positive social support and Positive therapeutic relationship  Continued Clinical Symptoms:  None  Cognitive Features That Contribute To Risk:  None  Suicide Risk:  Minimal: No identifiable suicidal ideation.  Patients presenting with no risk factors but with morbid ruminations; may be classified as minimal risk based on the severity of the depressive symptoms  Discharge Diagnoses:   AXIS I:  Schizoaffective Disorder AXIS II:  Deferred AXIS III:   Past Medical History  Diagnosis Date  . Bipolar 1 disorder   . Schizophrenia   . Hypertension     AXIS IV:  other psychosocial or environmental problems, problems related to social environment and problems with primary support group AXIS V:  61-70 mild symptoms  Plan Of Care/Follow-up recommendations:  Activity:  as tolerated Diet:  heart healthy diet  Is patient on multiple antipsychotic therapies at discharge:  No   Has Patient had three or more failed trials of antipsychotic monotherapy by history:  No  Recommended Plan for Multiple Antipsychotic Therapies: NA    LORD, JAMISON, PMH-NP 06/21/2014, 1:42 PM

## 2014-06-21 NOTE — ED Notes (Addendum)
Written dc instructions reviewed w/ Patient and Jinny SandersBetty Davis from Group home.  Change in medications reviewed--Pt is to stop his haldol, risperidol, cogentin and continue seroquel as directed and take his medical medications as directed.  Pt is to follow up w/ [facility] on Monday concerning his medications and needs see his primary care MD concerning his kidney function as soon possible.  Pt and Kathie RhodesBetty verbalized understanding.  Pt ambulatory w/o difficulty to dc window w/ mhT, AND [name]. Belongings returned after leaving the unit.

## 2014-06-21 NOTE — ED Notes (Addendum)
[name] SandersBetty davis at the group home contacted   By phone and spoke w/ Catha NottinghamJamison NP.

## 2014-06-21 NOTE — ED Provider Notes (Signed)
Patient is clear from a psych standpoint for discharge. He was noted to have an elevated creatinine. I rechecked it today and it was a little bit better than it was when he initially came in. I discussed this with the patient and I put it on his discharge papers that he needs to follow-up with his primary care physician and have this monitored. He does state that he has a primary care physician but he doesn't know the name of his doctor.  Rolan BuccoMelanie Maggy Wyble, MD 06/21/14 1500

## 2014-06-21 NOTE — BH Assessment (Signed)
Joann Glover, AC at [facility], confirms adult unit is at capacity. Contacted the following facilities for placement:  BED AVAILABLE, FAXED CLINICAL INFORMATION: [facility], per [facility], per [facility], per [facility], per [facility], per [facility]  AT CAPACITY: [facility], per [facility], per [facility], per [facility], per [facility], per [facility], per [facility], per [facility], per [facility], per [facility], per Gigi  NO RESPONSE: [facility]   Sathvika Ojo Ellis Oluwatobiloba Martin Jr, LPC, NCC Triage Specialist [phone]   

## 2014-06-21 NOTE — Progress Notes (Signed)
CSW spoke with the patient's guardian Shepard Generalnnette Hines [phone] which gave two numbers to contact the group home [phone] or [phone].  CSW called both numbers and left a message on the [phone] and the other is a wrong number.  CSW called Police non-emergency to send a LEO to the home to make contact with a staff member.    Maryelizabeth Rowanressa Geneveive Furness, MSW, LCSWA Evening Clinical Social Worker [phone]

## 2014-06-21 NOTE — ED Notes (Signed)
In day roomTalking w/ dr Lolly Mustachearfeen and Catha Nottinghamjamison

## 2014-06-21 NOTE — ED Notes (Signed)
Up in hall walking 

## 2014-06-21 NOTE — ED Notes (Signed)
On the phone 

## 2014-06-21 NOTE — ED Notes (Addendum)
EDP into see 

## 2014-06-21 NOTE — Progress Notes (Signed)
Was notified by Medication History Technician Ezra SitesJennifer Lahti that the patient remains unable to provide information regarding his medications or the pharmacy where his medications are dispensed.  Efforts to identify and contact his group home for information were also unsuccessful.  We are therefore unable to obtain a medication history at this time.  Elie Goodyandy Tyden Kann, PharmD, BCPS Pager: [phone] 06/21/2014  10:54 AM

## 2014-06-21 NOTE — Consult Note (Signed)
[facility] Face-to-Face Psychiatry Consult   Reason for Consult:  Agitation at his group home Referring Physician:  EDP  [name] is an 53 y.o. male. Total Time spent with patient: 45 minutes  Assessment: AXIS I:  Schizoaffective Disorder AXIS II:  Deferred AXIS III:   Past Medical History  Diagnosis Date  . Bipolar 1 disorder   . Schizophrenia   . Hypertension    AXIS IV:  other psychosocial or environmental problems and problems related to social environment AXIS V:  61-70 mild symptoms  Plan:  No evidence of imminent risk to self or others at present.  Dr. Lolly Mustache assessed the patient and concurs with the plan.  Subjective:   [name] is a 53 y.o. male patient does not warrant admission.  HPI:  The patient was dropped off at the ED for agitation at his group home.  We finally were able to get in touch with the group home after the TTS person called the police who went out to the group home.  We had called yesterday and today and left a message on one phone and the other phone, no one ever answered and no voice mail.  Once the group home was contacted, they said they did not want him back.  Yesterday, he was sent to the police without any medication records.  The group home stated he ran out of his two antipsychotics and then his behaviors issues started.  Seroquel started yesterday with positive results, Rx will be given.  At this time, the patient has been calm for 24 hours and denies suicidal/homicidal ideations, hallucinations, and alcohol/drug abuse.  Past Psychiatric History: Past Medical History  Diagnosis Date  . Bipolar 1 disorder   . Schizophrenia   . Hypertension     reports that he has never smoked. He does not have any smokeless tobacco history on file. He reports that he does not drink alcohol. His drug history is not on file. History reviewed. No pertinent family history.         Allergies:  No Known Allergies  ACT Assessment Complete:  Yes:     Educational Status    Risk to Self: Risk to self with the past 6 months Is patient at risk for suicide?: No Substance abuse history and/or treatment for substance abuse?: Yes  Risk to Others:    Abuse:    Prior Inpatient Therapy:    Prior Outpatient Therapy:    Additional Information:                    Objective: Blood pressure 158/98, pulse 84, temperature 97.6 F (36.4 C), temperature source Oral, resp. rate 18, SpO2 100.00%.There is no height or weight on file to calculate BMI. Results for orders placed during the hospital encounter of 06/19/14 (from the past 72 hour(s))  CBG MONITORING, ED     Status: Abnormal   Collection Time    06/19/14 10:13 PM      Result Value Ref Range   Glucose-Capillary 135 (*) 70 - 99 mg/dL  CBC WITH DIFFERENTIAL     Status: Abnormal   Collection Time    06/19/14 11:50 PM      Result Value Ref Range   WBC 6.6  4.0 - 10.5 K/uL   RBC 3.90 (*) 4.22 - 5.81 MIL/uL   Hemoglobin 10.2 (*) 13.0 - 17.0 g/dL   HCT 35.5 (*) 73.2 - 20.2 %   MCV 78.7  78.0 - 100.0 fL   MCH  26.2  26.0 - 34.0 pg   MCHC 33.2  30.0 - 36.0 g/dL   RDW 16.113.3  09.611.5 - 04.515.5 %   Platelets 190  150 - 400 K/uL   Neutrophils Relative % 59  43 - 77 %   Neutro Abs 3.9  1.7 - 7.7 K/uL   Lymphocytes Relative 25  12 - 46 %   Lymphs Abs 1.6  0.7 - 4.0 K/uL   Monocytes Relative 12  3 - 12 %   Monocytes Absolute 0.8  0.1 - 1.0 K/uL   Eosinophils Relative 3  0 - 5 %   Eosinophils Absolute 0.2  0.0 - 0.7 K/uL   Basophils Relative 1  0 - 1 %   Basophils Absolute 0.1  0.0 - 0.1 K/uL  ETHANOL     Status: None   Collection Time    06/19/14 11:50 PM      Result Value Ref Range   Alcohol, Ethyl (B) <11  0 - 11 mg/dL   Comment:            LOWEST DETECTABLE LIMIT FOR     SERUM ALCOHOL IS 11 mg/dL     FOR MEDICAL PURPOSES ONLY  ACETAMINOPHEN LEVEL     Status: None   Collection Time    06/19/14 11:50 PM      Result Value Ref Range   Acetaminophen (Tylenol), Serum <15.0  10 - 30  ug/mL   Comment:            THERAPEUTIC CONCENTRATIONS VARY     SIGNIFICANTLY. A RANGE OF 10-30     ug/mL MAY BE AN EFFECTIVE     CONCENTRATION FOR MANY PATIENTS.     HOWEVER, SOME ARE BEST TREATED     AT CONCENTRATIONS OUTSIDE THIS     RANGE.     ACETAMINOPHEN CONCENTRATIONS     >150 ug/mL AT 4 HOURS AFTER     INGESTION AND >50 ug/mL AT 12     HOURS AFTER INGESTION ARE     OFTEN ASSOCIATED WITH TOXIC     REACTIONS.  SALICYLATE LEVEL     Status: Abnormal   Collection Time    06/19/14 11:50 PM      Result Value Ref Range   Salicylate Lvl <2.0 (*) 2.8 - 20.0 mg/dL  I-STAT CHEM 8, ED     Status: Abnormal   Collection Time    06/20/14 12:00 AM      Result Value Ref Range   Sodium 139  137 - 147 mEq/L   Potassium 3.3 (*) 3.7 - 5.3 mEq/L   Chloride 107  96 - 112 mEq/L   BUN 19  6 - 23 mg/dL   Creatinine, Ser 4.092.90 (*) 0.50 - 1.35 mg/dL   Glucose, Bld 811137 (*) 70 - 99 mg/dL   Calcium, Ion 9.141.12  7.821.12 - 1.23 mmol/L   TCO2 20  0 - 100 mmol/L   Hemoglobin 11.2 (*) 13.0 - 17.0 g/dL   HCT 95.633.0 (*) 21.339.0 - 08.652.0 %  URINE RAPID DRUG SCREEN (HOSP PERFORMED)     Status: None   Collection Time    06/21/14  9:55 AM      Result Value Ref Range   Opiates NONE DETECTED  NONE DETECTED   Cocaine NONE DETECTED  NONE DETECTED   Benzodiazepines NONE DETECTED  NONE DETECTED   Amphetamines NONE DETECTED  NONE DETECTED   Tetrahydrocannabinol NONE DETECTED  NONE DETECTED   Barbiturates NONE DETECTED  NONE  DETECTED   Comment:            DRUG SCREEN FOR MEDICAL PURPOSES     ONLY.  IF CONFIRMATION IS NEEDED     FOR ANY PURPOSE, NOTIFY LAB     WITHIN 5 DAYS.                LOWEST DETECTABLE LIMITS     FOR URINE DRUG SCREEN     Drug Class       Cutoff (ng/mL)     Amphetamine      1000     Barbiturate      200     Benzodiazepine   200     Tricyclics       300     Opiates          300     Cocaine          300     THC              50  I-STAT CHEM 8, ED     Status: Abnormal   Collection Time     06/21/14 10:19 AM      Result Value Ref Range   Sodium 136 (*) 137 - 147 mEq/L   Potassium 4.2  3.7 - 5.3 mEq/L   Chloride 102  96 - 112 mEq/L   BUN 22  6 - 23 mg/dL   Creatinine, Ser 6.60 (*) 0.50 - 1.35 mg/dL   Glucose, Bld 630 (*) 70 - 99 mg/dL   Calcium, Ion 1.60 (*) 1.12 - 1.23 mmol/L   TCO2 21  0 - 100 mmol/L   Hemoglobin 10.9 (*) 13.0 - 17.0 g/dL   HCT 10.9 (*) 32.3 - 55.7 %   Labs are reviewed and are pertinent for no medical issues.  Current Facility-Administered Medications  Medication Dose Route Frequency Provider Last Rate Last Dose  . acetaminophen (TYLENOL) tablet 650 mg  650 mg Oral Q6H PRN Nanine Means, NP   650 mg at 06/21/14 0856  . hydrochlorothiazide (HYDRODIURIL) tablet 25 mg  25 mg Oral Daily Nanine Means, NP   25 mg at 06/21/14 1033  . lisinopril (PRINIVIL,ZESTRIL) tablet 20 mg  20 mg Oral Daily Nanine Means, NP   20 mg at 06/21/14 1033  . potassium chloride SA (K-DUR,KLOR-CON) CR tablet 10 mEq  10 mEq Oral Daily Nanine Means, NP   10 mEq at 06/20/14 1403  . QUEtiapine (SEROQUEL) tablet 100 mg  100 mg Oral QHS Nanine Means, NP   100 mg at 06/20/14 2111  . QUEtiapine (SEROQUEL) tablet 50 mg  50 mg Oral TID Nanine Means, NP   50 mg at 06/21/14 0753   No current outpatient prescriptions on file.    Psychiatric Specialty Exam:     Blood pressure 158/98, pulse 84, temperature 97.6 F (36.4 C), temperature source Oral, resp. rate 18, SpO2 100.00%.There is no height or weight on file to calculate BMI.  General Appearance: Casual  Eye Contact::  Good  Speech:  Normal Rate  Volume:  Normal  Mood:  Euthymic  Affect:  Congruent  Thought Process:  Coherent  Orientation:  Full (Time, Place, and Person)  Thought Content:  WDL  Suicidal Thoughts:  No  Homicidal Thoughts:  No  Memory:  Immediate;   Good Recent;   Good Remote;   Good  Judgement:  Fair  Insight:  Fair  Psychomotor Activity:  Normal  Concentration:  Good  Recall:  Good  Fund of Knowledge:Fair   Language: Fair  Akathisia:  No  Handed:  Right  AIMS (if indicated):     Assets:  Health and safety inspectorinancial Resources/Insurance Housing Leisure Time Physical Health Resilience Social Support  Sleep:      Musculoskeletal: Strength & Muscle Tone: within normal limits Gait & Station: normal Patient leans: N/A  Treatment Plan Summary: Discharge to group home and follow-up with his regular providers.  Nanine MeansLORD, JAMISON, PMH-NP 06/21/2014 1:16 PM  I have personally seen the patient and agreed with the findings and involved in the treatment plan. Kathryne SharperSyed Jaz Laningham, MD

## 2014-06-21 NOTE — Progress Notes (Signed)
CSW spoke with Baird LyonsCasey from the group home  [phone] and she will contact the owner to arrange transportation for discharge.

## 2014-06-21 NOTE — ED Notes (Addendum)
Attempted to contact [name] [facility]Betty [facility]--[phone]-no answer, pt sleeping in activity room

## 2014-06-21 NOTE — Progress Notes (Signed)
CSW and Catha NottinghamJamison, NP spoke with group home staff who confirms a staff member will be here around 5pm to pick the patient up for discharge.  NP verbally instructed the group home provider [facility].    Maryelizabeth Rowanressa Lyndee Herbst, MSW, LCSWA Evening Clinical Social Worker [phone]

## 2014-06-25 ENCOUNTER — Emergency Department (HOSPITAL_COMMUNITY)
Admission: EM | Admit: 2014-06-25 | Discharge: 2014-06-29 | Disposition: A | Discharge status: Home / Self Care | Payer: Medicare Other | Attending: Emergency Medicine | Admitting: Emergency Medicine

## 2014-06-25 ENCOUNTER — Encounter (HOSPITAL_COMMUNITY): Payer: Self-pay | Admitting: Emergency Medicine

## 2014-06-25 DIAGNOSIS — F919 Conduct disorder, unspecified: Secondary | ICD-10-CM | POA: Diagnosis not present

## 2014-06-25 DIAGNOSIS — Z79899 Other long term (current) drug therapy: Secondary | ICD-10-CM | POA: Insufficient documentation

## 2014-06-25 DIAGNOSIS — F319 Bipolar disorder, unspecified: Secondary | ICD-10-CM | POA: Diagnosis not present

## 2014-06-25 DIAGNOSIS — F250 Schizoaffective disorder, bipolar type: Secondary | ICD-10-CM

## 2014-06-25 DIAGNOSIS — F259 Schizoaffective disorder, unspecified: Secondary | ICD-10-CM | POA: Diagnosis not present

## 2014-06-25 DIAGNOSIS — R4689 Other symptoms and signs involving appearance and behavior: Secondary | ICD-10-CM

## 2014-06-25 DIAGNOSIS — Z046 Encounter for general psychiatric examination, requested by authority: Secondary | ICD-10-CM | POA: Diagnosis present

## 2014-06-25 DIAGNOSIS — I10 Essential (primary) hypertension: Secondary | ICD-10-CM | POA: Diagnosis not present

## 2014-06-25 LAB — CBC WITH DIFFERENTIAL/PLATELET
Basophils Absolute: 0 10*3/uL (ref 0.0–0.1)
Basophils Relative: 1 % (ref 0–1)
Eosinophils Absolute: 0.2 10*3/uL (ref 0.0–0.7)
Eosinophils Relative: 3 % (ref 0–5)
HEMATOCRIT: 32.4 % — AB (ref 39.0–52.0)
Hemoglobin: 10.8 g/dL — ABNORMAL LOW (ref 13.0–17.0)
LYMPHS PCT: 12 % (ref 12–46)
Lymphs Abs: 0.9 10*3/uL (ref 0.7–4.0)
MCH: 26.9 pg (ref 26.0–34.0)
MCHC: 33.3 g/dL (ref 30.0–36.0)
MCV: 80.6 fL (ref 78.0–100.0)
MONO ABS: 0.8 10*3/uL (ref 0.1–1.0)
MONOS PCT: 10 % (ref 3–12)
NEUTROS ABS: 6 10*3/uL (ref 1.7–7.7)
Neutrophils Relative %: 74 % (ref 43–77)
Platelets: 232 10*3/uL (ref 150–400)
RBC: 4.02 MIL/uL — ABNORMAL LOW (ref 4.22–5.81)
RDW: 13.2 % (ref 11.5–15.5)
WBC: 8 10*3/uL (ref 4.0–10.5)

## 2014-06-25 LAB — COMPREHENSIVE METABOLIC PANEL
ALT: 42 U/L (ref 0–53)
ANION GAP: 15 (ref 5–15)
AST: 43 U/L — ABNORMAL HIGH (ref 0–37)
Albumin: 3.4 g/dL — ABNORMAL LOW (ref 3.5–5.2)
Alkaline Phosphatase: 67 U/L (ref 39–117)
BILIRUBIN TOTAL: 0.4 mg/dL (ref 0.3–1.2)
BUN: 25 mg/dL — AB (ref 6–23)
CHLORIDE: 102 meq/L (ref 96–112)
CO2: 22 meq/L (ref 19–32)
CREATININE: 2.71 mg/dL — AB (ref 0.50–1.35)
Calcium: 8.4 mg/dL (ref 8.4–10.5)
GFR, EST AFRICAN AMERICAN: 29 mL/min — AB (ref >90)
GFR, EST NON AFRICAN AMERICAN: 25 mL/min — AB (ref >90)
GLUCOSE: 173 mg/dL — AB (ref 70–99)
Potassium: 4.2 mEq/L (ref 3.7–5.3)
Sodium: 139 mEq/L (ref 137–147)
Total Protein: 7.1 g/dL (ref 6.0–8.3)

## 2014-06-25 LAB — ETHANOL

## 2014-06-25 MED ORDER — ZOLPIDEM TARTRATE 5 MG PO TABS
5.0000 mg | ORAL_TABLET | Freq: Every evening | ORAL | Status: DC | PRN
Start: 2014-06-25 — End: 2014-06-25

## 2014-06-25 MED ORDER — NICOTINE 21 MG/24HR TD PT24: 21.0000 mg | MEDICATED_PATCH | Freq: Every day | TRANSDERMAL | Status: DC

## 2014-06-25 MED ORDER — TRAZODONE HCL 50 MG PO TABS
50.0000 mg | ORAL_TABLET | Freq: Every evening | ORAL | Status: DC | PRN
  Administered 2014-06-25 – 2014-06-28 (×6): 50 mg via ORAL
  Filled 2014-06-25 (×4): qty 1

## 2014-06-25 MED ORDER — ACETAMINOPHEN 325 MG PO TABS: 650.0000 mg | ORAL_TABLET | ORAL | Status: DC | PRN

## 2014-06-25 MED ORDER — ALUM & MAG HYDROXIDE-SIMETH 200-200-20 MG/5ML PO SUSP: 30.0000 mL | ORAL | Status: DC | PRN

## 2014-06-25 MED ORDER — QUETIAPINE FUMARATE 50 MG PO TABS: 50.0000 mg | ORAL_TABLET | Freq: Three times a day (TID) | ORAL | Status: DC

## 2014-06-25 MED ORDER — ONDANSETRON HCL 4 MG PO TABS: 4.0000 mg | ORAL_TABLET | Freq: Three times a day (TID) | ORAL | Status: DC | PRN

## 2014-06-25 MED ORDER — STERILE WATER FOR INJECTION IJ SOLN
INTRAMUSCULAR | Status: AC
  Administered 2014-06-25: 20:00:00
  Filled 2014-06-25: qty 10

## 2014-06-25 MED ORDER — LORAZEPAM 1 MG PO TABS
1.0000 mg | ORAL_TABLET | Freq: Three times a day (TID) | ORAL | Status: DC | PRN
  Administered 2014-06-25 – 2014-06-26 (×3): 1 mg via ORAL
  Filled 2014-06-25 (×3): qty 1

## 2014-06-25 MED ORDER — HYDROCHLOROTHIAZIDE 25 MG PO TABS
25.0000 mg | ORAL_TABLET | Freq: Every day | ORAL | Status: DC
  Administered 2014-06-26 – 2014-06-29 (×4): 25 mg via ORAL
  Filled 2014-06-25 (×4): qty 1

## 2014-06-25 MED ORDER — POTASSIUM CHLORIDE ER 10 MEQ PO TBCR
10.0000 meq | EXTENDED_RELEASE_TABLET | Freq: Every day | ORAL | Status: DC
  Administered 2014-06-26 – 2014-06-29 (×4): 10 meq via ORAL
  Filled 2014-06-25: qty 0.5
  Filled 2014-06-25 (×4): qty 1

## 2014-06-25 MED ORDER — ZIPRASIDONE MESYLATE 20 MG IM SOLR
10.0000 mg | Freq: Once | INTRAMUSCULAR | Status: AC
  Administered 2014-06-25: 10 mg via INTRAMUSCULAR
  Filled 2014-06-25: qty 20

## 2014-06-25 MED ORDER — IBUPROFEN 200 MG PO TABS: 600.0000 mg | ORAL_TABLET | Freq: Three times a day (TID) | ORAL | Status: DC | PRN

## 2014-06-25 MED ORDER — QUETIAPINE FUMARATE 100 MG PO TABS
100.0000 mg | ORAL_TABLET | Freq: Every day | ORAL | Status: DC
  Administered 2014-06-26 – 2014-06-28 (×3): 100 mg via ORAL
  Filled 2014-06-25 (×2): qty 1

## 2014-06-25 NOTE — ED Notes (Signed)
Pt arrived with [employer] under IVC  Paperwork states pt is hostile and aggressive towards staff and peers  Pt has been charging towards staff and throwing chairs and trash cans  Pt has been diagnosed with schizophrenia, acute psychosis, and HTN  The group home staff worker states he has not been taking his medication  Pt reports seeing imaginary people and often converses with those people  Pt states the voices are telling him to fight the police  Pt has been committed in the past  Paperwork states the pt is a danger to himself and others

## 2014-06-25 NOTE — ED Notes (Signed)
Pt not coorperating with staff, [employer] assisting with pt changing into scrubs

## 2014-06-25 NOTE — ED Notes (Signed)
Patient pacing unit, banging on nursing station glass, mumbling threats, not answering questions. NAD
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
## 2014-06-26 ENCOUNTER — Encounter (HOSPITAL_COMMUNITY): Payer: Self-pay | Admitting: *Deleted

## 2014-06-26 DIAGNOSIS — F250 Schizoaffective disorder, bipolar type: Secondary | ICD-10-CM

## 2014-06-26 DIAGNOSIS — F259 Schizoaffective disorder, unspecified: Secondary | ICD-10-CM | POA: Diagnosis not present

## 2014-06-26 MED ORDER — DIPHENHYDRAMINE HCL 50 MG/ML IJ SOLN
25.0000 mg | Freq: Once | INTRAMUSCULAR | Status: AC
  Administered 2014-06-26: 25 mg via INTRAMUSCULAR
  Filled 2014-06-26: qty 1

## 2014-06-26 MED ORDER — LORAZEPAM 2 MG/ML IJ SOLN
2.0000 mg | Freq: Once | INTRAMUSCULAR | Status: AC
  Administered 2014-06-26: 2 mg via INTRAMUSCULAR
  Filled 2014-06-26: qty 1

## 2014-06-26 MED ORDER — ZIPRASIDONE MESYLATE 20 MG IM SOLR
20.0000 mg | Freq: Once | INTRAMUSCULAR | Status: AC
  Administered 2014-06-26: 20 mg via INTRAMUSCULAR
  Filled 2014-06-26: qty 20

## 2014-06-26 MED ORDER — QUETIAPINE FUMARATE 50 MG PO TABS
ORAL_TABLET | ORAL | Status: AC
  Administered 2014-06-26: 09:00:00
  Filled 2014-06-26: qty 1

## 2014-06-26 MED ORDER — CARBAMAZEPINE 200 MG PO TABS
200.0000 mg | ORAL_TABLET | Freq: Two times a day (BID) | ORAL | Status: DC
  Administered 2014-06-26 – 2014-06-29 (×7): 200 mg via ORAL
  Filled 2014-06-26 (×9): qty 1

## 2014-06-26 MED ORDER — NICOTINE 21 MG/24HR TD PT24
MEDICATED_PATCH | TRANSDERMAL | Status: AC
  Filled 2014-06-26: qty 1
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
## 2014-06-26 NOTE — ED Notes (Signed)
Patient pacing up and down halls

## 2014-06-26 NOTE — ED Notes (Signed)
Pt ambulatory about hallway, pt requested to speak with caregiver/Ms [name].  Spoke with Ms [name] PlaterDavis via phone.  Pt continues to ramble at times.  Will continue to monitor for safety.
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
## 2014-06-26 NOTE — ED Notes (Signed)
Pt's blood pressure elevated throughout the day. Pt was given HCTZ earlier this shift. Reported to NP and instructed to call EDP. Called EDP Dr. Loretha StaplerWofford and reported blood pressures. No new orders at this time. Pt is sitting in dayroom watching TV and has no signs and symptoms of distress. Safety maintained in the [facility].
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
## 2014-06-26 NOTE — ED Notes (Signed)
Pt is irritable pacing in the hall and talking loudly from one subject to another. Pt is responding to internal stimuli as evidence by talking when no one else around, laughing inappropriately ,swinging his arms and talking about World War 1, horses and cursing often. Pt is paranoid saying that his water is poisoned. Pt would not let staff recheck blood pressure. Checked once with dinamap 164/108 pulse 98 and 92%room air. Reported to NP. Gave 1000 medications early along with prn ativan for agitation. Offered support and redirection. Safety maintained on the unit.

## 2014-06-26 NOTE — ED Notes (Signed)
RN Spoke with [name] about walking in other pts rooms.  Pt responded by cursing at staff.

## 2014-06-27 ENCOUNTER — Encounter (HOSPITAL_COMMUNITY): Payer: Self-pay | Admitting: Psychiatry

## 2014-06-27 DIAGNOSIS — F259 Schizoaffective disorder, unspecified: Secondary | ICD-10-CM

## 2014-06-27 MED ORDER — LISINOPRIL 20 MG PO TABS
20.0000 mg | ORAL_TABLET | Freq: Every day | ORAL | Status: DC
  Administered 2014-06-27: 20 mg via ORAL
  Filled 2014-06-27 (×2): qty 1

## 2014-06-27 MED ORDER — QUETIAPINE FUMARATE 50 MG PO TABS
50.0000 mg | ORAL_TABLET | Freq: Three times a day (TID) | ORAL | Status: DC
  Administered 2014-06-27 – 2014-06-28 (×4): 50 mg via ORAL
  Administered 2014-06-28: 22:00:00 via ORAL
  Administered 2014-06-29 (×3): 50 mg via ORAL
  Filled 2014-06-27 (×7): qty 1

## 2014-06-27 MED ORDER — QUETIAPINE FUMARATE 50 MG PO TABS
ORAL_TABLET | ORAL | Status: AC
  Administered 2014-06-27: 18:00:00
  Filled 2014-06-27: qty 1

## 2014-06-27 MED ORDER — QUETIAPINE FUMARATE 50 MG PO TABS
ORAL_TABLET | ORAL | Status: AC
  Filled 2014-06-27: qty 1

## 2014-06-27 NOTE — ED Notes (Addendum)
Pt remains uncooperative and belligerent at times, cursing staff.  Security & [employer] at bedside for assistance.  Pt redirected back to room.

## 2014-06-27 NOTE — Progress Notes (Signed)
  CARE MANAGEMENT ED NOTE 06/27/2014  Patient:  [name],[name]   Account Number:  [id number]  Date Initiated:  06/27/2014  Documentation initiated by:  Edd ArbourGIBBS,KIMBERLY  Subjective/Objective Assessment:   53 yr old medicare pt arrived with [employer] under IVC  Paperwork states pt is hostile and aggressive towards staff and peers     Subjective/Objective Assessment Detail:   dx schizoaffective disorder     Action/Plan:   ED CM went to see pt he was asleep unable to arouse Pt noted earlier in day talking to him self pcp assessment postpone   Action/Plan Detail:   Anticipated DC Date:       Status Recommendation to Physician:   Result of Recommendation:    Other ED Services  Consult Working Plan    DC [employer]  Other  Outpatient Services - Pt will follow up  PCP issues    Choice offered to / List presented to:            Status of service:  Completed, signed off  ED Comments:   ED Comments Detail:

## 2014-06-27 NOTE — ED Notes (Signed)
Patient is pacing back and forth in the hallway and making repetitive statements. Patient can be verbally aggressive at times but can be redirected at this time.

## 2014-06-27 NOTE — Consult Note (Signed)
[facility] Consult   Reason for Consult:  Agitation, schizoaffective disorder Referring Physician:  EDP  [name] is an 53 y.o. male. Total Time spent with patient: 45 minutes  Assessment: AXIS I:  Schizoaffective Disorder AXIS II:  Deferred AXIS III:   Past Medical History  Diagnosis Date  . Bipolar 1 disorder   . Schizophrenia   . Hypertension    AXIS IV:  other psychosocial or environmental problems, problems related to social environment and problems with primary support group AXIS V:  21-30 behavior considerably influenced by delusions or hallucinations OR serious impairment in judgment, communication OR inability to function in almost all areas  Plan:  Recommend psychiatric Inpatient admission when medically cleared.  Dr. Darleene Cleaver assessed the patient and concurs with the plan.  Subjective:   [name] is a 53 y.o. male patient admitted with exacerbation of his schizoaffective disorder.  HPI:  The patient was irritable on assessment and stated he wanted to return to his group home but per report either he is not taking his medication or they are not giving it to him.   HPI Elements:   Location:  generalized. Quality:  acute. Severity:  severe. Timing:  constant. Duration:  two days. Context:  stressors, not taking his medications.  Past Psychiatric History: Past Medical History  Diagnosis Date  . Bipolar 1 disorder   . Schizophrenia   . Hypertension     reports that he has never smoked. He does not have any smokeless tobacco history on file. He reports that he does not drink alcohol or use illicit drugs. History reviewed. No pertinent family history. Family History Substance Abuse: No Family Supports: No (None reported ) Living Arrangements: Other (Comment) (Resides at [facility] ) Can pt return to current living arrangement?: No Abuse/Neglect [facility]) Physical Abuse: Denies Verbal Abuse: Denies Sexual Abuse: Denies Allergies:  No  Known Allergies  ACT Assessment Complete:  Yes:    Educational Status    Risk to Self: Risk to self with the past 6 months Suicidal Ideation: No Suicidal Intent: No Is patient at risk for suicide?: No Suicidal Plan?: No Access to Means: No What has been your use of drugs/alcohol within the last 12 months?: None  Previous Attempts/Gestures: No How many times?: 0 Other Self Harm Risks: None  Triggers for Past Attempts: None known Intentional Self Injurious Behavior: None Family Suicide History: No Recent stressful life event(s): Other (Comment) (Chronic mental illness ) Persecutory voices/beliefs?: Yes Depression: No Depression Symptoms:  (None reported ) Substance abuse history and/or treatment for substance abuse?: No Suicide prevention information given to non-admitted patients: Not applicable  Risk to Others: Risk to Others within the past 6 months Homicidal Ideation: No Thoughts of Harm to Others: No Current Homicidal Intent: No Current Homicidal Plan: No Access to Homicidal Means: No Identified Victim: None  History of harm to others?: Yes Assessment of Violence: On admission Violent Behavior Description: Pt is verbally agressive and threatening towards staff Does patient have access to weapons?: No Criminal Charges Pending?: No Does patient have a court date: No  Abuse: Abuse/Neglect Assessment (Assessment to be complete while patient is alone) Physical Abuse: Denies Verbal Abuse: Denies Sexual Abuse: Denies Exploitation of patient/patient's resources: Denies Self-Neglect: Denies  Prior Inpatient Therapy: Prior Inpatient Therapy Prior Inpatient Therapy: Yes Prior Therapy Dates: 2006,2011 Prior Therapy Facilty/Provider(s): [facility]  Reason for Treatment: Schizophrenia   Prior Outpatient Therapy: Prior Outpatient Therapy Prior Outpatient Therapy: No Prior Therapy Dates: None  Prior  Therapy Facilty/Provider(s): None  Reason for Treatment: None   Additional  Information: Additional Information 1:1 In Past 12 Months?: No CIRT Risk: No Elopement Risk: No Does patient have medical clearance?: Yes                  Objective: Blood pressure 151/90, pulse 73, temperature 97.6 F (36.4 C), temperature source Oral, resp. rate 20, SpO2 100.00%.There is no height or weight on file to calculate BMI. Results for orders placed during the hospital encounter of 06/25/14 (from the past 72 hour(s))  CBC WITH DIFFERENTIAL     Status: Abnormal   Collection Time    06/25/14  7:43 PM      Result Value Ref Range   WBC 8.0  4.0 - 10.5 K/uL   RBC 4.02 (*) 4.22 - 5.81 MIL/uL   Hemoglobin 10.8 (*) 13.0 - 17.0 g/dL   HCT 32.4 (*) 39.0 - 52.0 %   MCV 80.6  78.0 - 100.0 fL   MCH 26.9  26.0 - 34.0 pg   MCHC 33.3  30.0 - 36.0 g/dL   RDW 13.2  11.5 - 15.5 %   Platelets 232  150 - 400 K/uL   Neutrophils Relative % 74  43 - 77 %   Neutro Abs 6.0  1.7 - 7.7 K/uL   Lymphocytes Relative 12  12 - 46 %   Lymphs Abs 0.9  0.7 - 4.0 K/uL   Monocytes Relative 10  3 - 12 %   Monocytes Absolute 0.8  0.1 - 1.0 K/uL   Eosinophils Relative 3  0 - 5 %   Eosinophils Absolute 0.2  0.0 - 0.7 K/uL   Basophils Relative 1  0 - 1 %   Basophils Absolute 0.0  0.0 - 0.1 K/uL  COMPREHENSIVE METABOLIC PANEL     Status: Abnormal   Collection Time    06/25/14  7:43 PM      Result Value Ref Range   Sodium 139  137 - 147 mEq/L   Potassium 4.2  3.7 - 5.3 mEq/L   Chloride 102  96 - 112 mEq/L   CO2 22  19 - 32 mEq/L   Glucose, Bld 173 (*) 70 - 99 mg/dL   BUN 25 (*) 6 - 23 mg/dL   Creatinine, Ser 2.71 (*) 0.50 - 1.35 mg/dL   Calcium 8.4  8.4 - 10.5 mg/dL   Total Protein 7.1  6.0 - 8.3 g/dL   Albumin 3.4 (*) 3.5 - 5.2 g/dL   AST 43 (*) 0 - 37 U/L   ALT 42  0 - 53 U/L   Alkaline Phosphatase 67  39 - 117 U/L   Total Bilirubin 0.4  0.3 - 1.2 mg/dL   GFR calc non Af Amer 25 (*) >90 mL/min   GFR calc Af Amer 29 (*) >90 mL/min   Comment: (NOTE)     The eGFR has been calculated  using the CKD EPI equation.     This calculation has not been validated in all clinical situations.     eGFR's persistently <90 mL/min signify possible Chronic Kidney     Disease.   Anion gap 15  5 - 15  ETHANOL     Status: None   Collection Time    06/25/14  7:43 PM      Result Value Ref Range   Alcohol, Ethyl (B) <11  0 - 11 mg/dL   Comment:  LOWEST DETECTABLE LIMIT FOR     SERUM ALCOHOL IS 11 mg/dL     FOR MEDICAL PURPOSES ONLY   Labs are reviewed and are pertinent for no medical issues.  Current Facility-Administered Medications  Medication Dose Route Frequency Provider Last Rate Last Dose  . acetaminophen (TYLENOL) tablet 650 mg  650 mg Oral Q4H PRN Evelina Bucy, MD      . alum & mag hydroxide-simeth (MAALOX/MYLANTA) 200-200-20 MG/5ML suspension 30 mL  30 mL Oral PRN Evelina Bucy, MD      . carbamazepine (TEGRETOL) tablet 200 mg  200 mg Oral BID PC Ranata Laughery   200 mg at 06/27/14 0936  . hydrochlorothiazide (HYDRODIURIL) tablet 25 mg  25 mg Oral Daily Evelina Bucy, MD   25 mg at 06/27/14 0936  . ibuprofen (ADVIL,MOTRIN) tablet 600 mg  600 mg Oral Q8H PRN Evelina Bucy, MD      . lisinopril (PRINIVIL,ZESTRIL) tablet 20 mg  20 mg Oral Daily Waylan Boga, NP      . LORazepam (ATIVAN) tablet 1 mg  1 mg Oral Q8H PRN Evelina Bucy, MD   1 mg at 06/26/14 2122  . nicotine (NICODERM CQ - dosed in mg/24 hours) patch 21 mg  21 mg Transdermal Daily Evelina Bucy, MD      . ondansetron [facility]) tablet 4 mg  4 mg Oral Q8H PRN Evelina Bucy, MD      . potassium chloride (K-DUR) CR tablet 10 mEq  10 mEq Oral Daily Evelina Bucy, MD   10 mEq at 06/27/14 0935  . QUEtiapine (SEROQUEL) tablet 100 mg  100 mg Oral QHS Evelina Bucy, MD   100 mg at 06/26/14 2129  . QUEtiapine (SEROQUEL) tablet 50 mg  50 mg Oral TID Waylan Boga, NP      . traZODone (DESYREL) tablet 50 mg  50 mg Oral QHS,MR X 1 Laverle Hobby, PA-C   50 mg at 06/26/14 2130   Current Outpatient Prescriptions  Medication Sig  Dispense Refill  . hydrochlorothiazide (HYDRODIURIL) 25 MG tablet Take 1 tablet (25 mg total) by mouth daily.      Marland Kitchen LORazepam (ATIVAN) 1 MG tablet Take 1 tablet (1 mg total) by mouth every 6 (six) hours as needed for anxiety.  30 tablet  0  . potassium chloride SA (K-DUR,KLOR-CON) 10 MEQ tablet Take 1 tablet (10 mEq total) by mouth daily.  30 tablet  0  . QUEtiapine (SEROQUEL) 100 MG tablet Take 1 tablet (100 mg total) by mouth at bedtime.  30 tablet  0  . QUEtiapine (SEROQUEL) 50 MG tablet Take 1 tablet (50 mg total) by mouth 3 (three) times daily.  90 tablet  0    Psychiatric Specialty Exam:     Blood pressure 151/90, pulse 73, temperature 97.6 F (36.4 C), temperature source Oral, resp. rate 20, SpO2 100.00%.There is no height or weight on file to calculate BMI.  General Appearance: Casual  Eye Contact::  Fair  Speech:  Normal Rate  Volume:  Normal  Mood:  Irritable  Affect:  Flat  Thought Process:  Irrelevant  Orientation:  Full (Time, Place, and Person)  Thought Content:  Hallucinations: Auditory Visual  Suicidal Thoughts:  No  Homicidal Thoughts:  No  Memory:  Immediate;   Fair Recent;   Poor Remote;   Poor  Judgement:  Poor  Insight:  Lacking  Psychomotor Activity:  Decreased  Concentration:  Fair  Recall:  Poor  Fund of Knowledge:Fair  Language: Fair  Akathisia:  No  Handed:  Right  AIMS (if indicated):     Assets:  Financial Resources/Insurance Housing Physical Health Resilience Transportation  Sleep:      Musculoskeletal: Strength & Muscle Tone: within normal limits Gait & Station: normal Patient leans: N/A  Treatment Plan Summary: Daily contact with patient to assess and evaluate symptoms and progress in treatment Medication management; restart home medications and admit to inpatient psychiatric unit for stabilization  Waylan Boga, PMH-NP 06/27/2014 1:32 PM  Patient seen, evaluated and I agree with notes by Nurse Practitioner. Corena Pilgrim,  MD

## 2014-06-27 NOTE — ED Notes (Signed)
Report received from MayotteBrittany RN. Pt. Alert and oriented in no distress denies SI, HI, AVH and pain. Will continue to monitor for safety. Pt. Instructed to come to me with problems or concerns. Q 15 minute checks continue.

## 2014-06-27 NOTE — Progress Notes (Signed)
Pt has been assessed and meets inpatient criteria for treatment. Pt clinicals faxed out to:  [facility]  Will continue to seek placement.  Derrell Lollingoris Poet Hineman, MSW  Social Worker [phone]

## 2014-06-28 DIAGNOSIS — F259 Schizoaffective disorder, unspecified: Secondary | ICD-10-CM | POA: Diagnosis not present

## 2014-06-28 LAB — CBG MONITORING, ED
GLUCOSE-CAPILLARY: 245 mg/dL — AB (ref 70–99)
GLUCOSE-CAPILLARY: 80 mg/dL (ref 70–99)
Glucose-Capillary: 149 mg/dL — ABNORMAL HIGH (ref 70–99)

## 2014-06-28 MED ORDER — QUETIAPINE FUMARATE 100 MG PO TABS
ORAL_TABLET | ORAL | Status: AC
  Filled 2014-06-28: qty 1

## 2014-06-28 MED ORDER — TRAZODONE HCL 50 MG PO TABS
ORAL_TABLET | ORAL | Status: AC
  Administered 2014-06-28: 22:00:00
  Filled 2014-06-28: qty 1

## 2014-06-28 MED ORDER — CLONIDINE HCL 0.1 MG PO TABS
0.2000 mg | ORAL_TABLET | Freq: Two times a day (BID) | ORAL | Status: DC
  Administered 2014-06-28 – 2014-06-29 (×3): 0.2 mg via ORAL
  Filled 2014-06-28: qty 2

## 2014-06-28 MED ORDER — GLIPIZIDE 5 MG PO TABS
5.0000 mg | ORAL_TABLET | Freq: Two times a day (BID) | ORAL | Status: DC
  Administered 2014-06-28 – 2014-06-29 (×3): 5 mg via ORAL
  Filled 2014-06-28 (×5): qty 1

## 2014-06-28 MED ORDER — ATORVASTATIN CALCIUM 20 MG PO TABS
20.0000 mg | ORAL_TABLET | Freq: Every day | ORAL | Status: DC
  Administered 2014-06-28: 20 mg via ORAL
  Filled 2014-06-28 (×2): qty 1

## 2014-06-28 MED ORDER — AMLODIPINE BESYLATE 10 MG PO TABS
10.0000 mg | ORAL_TABLET | Freq: Every day | ORAL | Status: DC
  Administered 2014-06-28 – 2014-06-29 (×2): 10 mg via ORAL
  Filled 2014-06-28 (×2): qty 1

## 2014-06-28 MED ORDER — CLONIDINE HCL 0.1 MG PO TABS
ORAL_TABLET | ORAL | Status: AC
Start: 2014-06-28 — End: 2014-06-29
  Filled 2014-06-28: qty 2

## 2014-06-28 MED ORDER — MENTHOL 3 MG MT LOZG
1.0000 | LOZENGE | OROMUCOSAL | Status: DC | PRN
  Filled 2014-06-28: qty 9

## 2014-06-28 MED ORDER — INSULIN ASPART 100 UNIT/ML ~~LOC~~ SOLN
4.0000 [IU] | Freq: Three times a day (TID) | SUBCUTANEOUS | Status: DC
  Administered 2014-06-28 – 2014-06-29 (×2): 4 [IU] via SUBCUTANEOUS
  Filled 2014-06-28 (×2): qty 1

## 2014-06-28 MED ORDER — CLONIDINE HCL 0.1 MG PO TABS
ORAL_TABLET | ORAL | Status: AC
  Filled 2014-06-28: qty 2

## 2014-06-28 MED ORDER — CLOPIDOGREL BISULFATE 75 MG PO TABS
75.0000 mg | ORAL_TABLET | Freq: Every day | ORAL | Status: DC
  Administered 2014-06-28 – 2014-06-29 (×2): 75 mg via ORAL
  Filled 2014-06-28 (×2): qty 1

## 2014-06-28 MED ORDER — INSULIN ASPART 100 UNIT/ML ~~LOC~~ SOLN
0.0000 [IU] | Freq: Three times a day (TID) | SUBCUTANEOUS | Status: DC
  Administered 2014-06-28: 2 [IU] via SUBCUTANEOUS
  Administered 2014-06-29: 5 [IU] via SUBCUTANEOUS
  Filled 2014-06-28 (×2): qty 1

## 2014-06-28 NOTE — BH Assessment (Signed)
[facility] due to medical acuity.

## 2014-06-28 NOTE — ED Notes (Signed)
Dr Lolly Mustachearfeen and Catha Nottinghamjamison into see pt

## 2014-06-28 NOTE — ED Notes (Signed)
Talking w/ CSW 

## 2014-06-28 NOTE — ED Notes (Signed)
IVC has been rescended

## 2014-06-28 NOTE — ED Notes (Signed)
In day room talking

## 2014-06-28 NOTE — Progress Notes (Addendum)
3:34pm. CSW spoke with Jinny SandersBetty Davis at [phone]. Per Earlene Plateravis, she is debating whether she will accept pt back at group home and that she needed to meet with her staff to decide. She stated she would not pick him up today. CSW stated she would have to file a report against group home for abandonment. Earlene PlaterDavis stated she understood, but she would not pick up pt until she had spoken with staff at her home.  2:03pm. CSW called pt's group home for them to pick up pt, [phone]. Worker directed CSW to call supervisor, Jinny SandersBetty Davis, at #s [phone] and [phone]. [phone] went straight to voicemail. CSW left message at [phone].  Mariann LasterAlexandra Nicloe Frontera LCSWA,     ED CSW  phone: [phone]

## 2014-06-28 NOTE — ED Notes (Signed)
Up to the bathroom 

## 2014-06-28 NOTE — Consult Note (Signed)
[facility] Consult   Reason for Consult:  Agitation, schizoaffective disorder Referring Physician:  EDP  [name] is an 53 y.o. male. Total Time spent with patient: 30 minutes  Assessment: AXIS I:  Schizoaffective Disorder AXIS II:  Deferred AXIS III:   Past Medical History  Diagnosis Date  . Bipolar 1 disorder   . Schizophrenia   . Hypertension    AXIS IV:  other psychosocial or environmental problems, problems related to social environment and problems with primary support group AXIS V:  70; mild symptoms Plan:  Discharge back to his group home.  Dr. Adele Schilder assessed the patient and concurs with the plan.  Subjective:   [name] is a 53 y.o. male patient does not warrant admission.  HPI:  The patient has stabilized on his medications.  Denies suicidal/homicidal ideations, hallucinations, and alcohol/drug abuse.   Past Psychiatric History: Past Medical History  Diagnosis Date  . Bipolar 1 disorder   . Schizophrenia   . Hypertension     reports that he has never smoked. He does not have any smokeless tobacco history on file. He reports that he does not drink alcohol or use illicit drugs. History reviewed. No pertinent family history. Family History Substance Abuse: No Family Supports: No (None reported ) Living Arrangements: Other (Comment) (Resides at [facility] ) Can pt return to current living arrangement?: No Abuse/Neglect [facility]) Physical Abuse: Denies Verbal Abuse: Denies Sexual Abuse: Denies Allergies:  No Known Allergies  ACT Assessment Complete:  Yes:    Educational Status    Risk to Self: Risk to self with the past 6 months Suicidal Ideation: No Suicidal Intent: No Is patient at risk for suicide?: No Suicidal Plan?: No Access to Means: No What has been your use of drugs/alcohol within the last 12 months?: None  Previous Attempts/Gestures: No How many times?: 0 Other Self Harm Risks: None  Triggers for Past Attempts:  None known Intentional Self Injurious Behavior: None Family Suicide History: No Recent stressful life event(s): Other (Comment) (Chronic mental illness ) Persecutory voices/beliefs?: Yes Depression: No Depression Symptoms:  (None reported ) Substance abuse history and/or treatment for substance abuse?: No Suicide prevention information given to non-admitted patients: Not applicable  Risk to Others: Risk to Others within the past 6 months Homicidal Ideation: No Thoughts of Harm to Others: No Current Homicidal Intent: No Current Homicidal Plan: No Access to Homicidal Means: No Identified Victim: None  History of harm to others?: Yes Assessment of Violence: On admission Violent Behavior Description: Pt is verbally agressive and threatening towards staff Does patient have access to weapons?: No Criminal Charges Pending?: No Does patient have a court date: No  Abuse: Abuse/Neglect Assessment (Assessment to be complete while patient is alone) Physical Abuse: Denies Verbal Abuse: Denies Sexual Abuse: Denies Exploitation of patient/patient's resources: Denies Self-Neglect: Denies  Prior Inpatient Therapy: Prior Inpatient Therapy Prior Inpatient Therapy: Yes Prior Therapy Dates: 2006,2011 Prior Therapy Facilty/Provider(s): [facility]  Reason for Treatment: Schizophrenia   Prior Outpatient Therapy: Prior Outpatient Therapy Prior Outpatient Therapy: No Prior Therapy Dates: None  Prior Therapy Facilty/Provider(s): None  Reason for Treatment: None   Additional Information: Additional Information 1:1 In Past 12 Months?: No CIRT Risk: No Elopement Risk: No Does patient have medical clearance?: Yes                  Objective: Blood pressure 129/75, pulse 66, temperature 97.9 F (36.6 C), temperature source Oral, resp. rate 18, SpO2 97.00%.There is no height  or weight on file to calculate BMI. Results for orders placed during the hospital encounter of 06/25/14 (from the past 72  hour(s))  CBC WITH DIFFERENTIAL     Status: Abnormal   Collection Time    06/25/14  7:43 PM      Result Value Ref Range   WBC 8.0  4.0 - 10.5 K/uL   RBC 4.02 (*) 4.22 - 5.81 MIL/uL   Hemoglobin 10.8 (*) 13.0 - 17.0 g/dL   HCT 32.4 (*) 39.0 - 52.0 %   MCV 80.6  78.0 - 100.0 fL   MCH 26.9  26.0 - 34.0 pg   MCHC 33.3  30.0 - 36.0 g/dL   RDW 13.2  11.5 - 15.5 %   Platelets 232  150 - 400 K/uL   Neutrophils Relative % 74  43 - 77 %   Neutro Abs 6.0  1.7 - 7.7 K/uL   Lymphocytes Relative 12  12 - 46 %   Lymphs Abs 0.9  0.7 - 4.0 K/uL   Monocytes Relative 10  3 - 12 %   Monocytes Absolute 0.8  0.1 - 1.0 K/uL   Eosinophils Relative 3  0 - 5 %   Eosinophils Absolute 0.2  0.0 - 0.7 K/uL   Basophils Relative 1  0 - 1 %   Basophils Absolute 0.0  0.0 - 0.1 K/uL  COMPREHENSIVE METABOLIC PANEL     Status: Abnormal   Collection Time    06/25/14  7:43 PM      Result Value Ref Range   Sodium 139  137 - 147 mEq/L   Potassium 4.2  3.7 - 5.3 mEq/L   Chloride 102  96 - 112 mEq/L   CO2 22  19 - 32 mEq/L   Glucose, Bld 173 (*) 70 - 99 mg/dL   BUN 25 (*) 6 - 23 mg/dL   Creatinine, Ser 2.71 (*) 0.50 - 1.35 mg/dL   Calcium 8.4  8.4 - 10.5 mg/dL   Total Protein 7.1  6.0 - 8.3 g/dL   Albumin 3.4 (*) 3.5 - 5.2 g/dL   AST 43 (*) 0 - 37 U/L   ALT 42  0 - 53 U/L   Alkaline Phosphatase 67  39 - 117 U/L   Total Bilirubin 0.4  0.3 - 1.2 mg/dL   GFR calc non Af Amer 25 (*) >90 mL/min   GFR calc Af Amer 29 (*) >90 mL/min   Comment: (NOTE)     The eGFR has been calculated using the CKD EPI equation.     This calculation has not been validated in all clinical situations.     eGFR's persistently <90 mL/min signify possible Chronic Kidney     Disease.   Anion gap 15  5 - 15  ETHANOL     Status: None   Collection Time    06/25/14  7:43 PM      Result Value Ref Range   Alcohol, Ethyl (B) <11  0 - 11 mg/dL   Comment:            LOWEST DETECTABLE LIMIT FOR     SERUM ALCOHOL IS 11 mg/dL     FOR MEDICAL  PURPOSES ONLY  CBG MONITORING, ED     Status: Abnormal   Collection Time    06/28/14  9:40 AM      Result Value Ref Range   Glucose-Capillary 245 (*) 70 - 99 mg/dL  CBG MONITORING, ED     Status: Abnormal  Collection Time    06/28/14 12:44 PM      Result Value Ref Range   Glucose-Capillary 149 (*) 70 - 99 mg/dL   Labs are reviewed and are pertinent for no medical issues.  Current Facility-Administered Medications  Medication Dose Route Frequency Provider Last Rate Last Dose  . acetaminophen (TYLENOL) tablet 650 mg  650 mg Oral Q4H PRN Evelina Bucy, MD      . alum & mag hydroxide-simeth (MAALOX/MYLANTA) 200-200-20 MG/5ML suspension 30 mL  30 mL Oral PRN Evelina Bucy, MD      . amLODipine (NORVASC) tablet 10 mg  10 mg Oral Daily Waylan Boga, NP   10 mg at 06/28/14 1038  . atorvastatin (LIPITOR) tablet 20 mg  20 mg Oral q1800 Waylan Boga, NP      . carbamazepine (TEGRETOL) tablet 200 mg  200 mg Oral BID PC Mojeed Akintayo   200 mg at 06/28/14 1038  . cloNIDine (CATAPRES) tablet 0.2 mg  0.2 mg Oral BID Waylan Boga, NP   0.2 mg at 06/28/14 1037  . clopidogrel (PLAVIX) tablet 75 mg  75 mg Oral Daily Waylan Boga, NP   75 mg at 06/28/14 1039  . glipiZIDE (GLUCOTROL) tablet 5 mg  5 mg Oral BID AC Waylan Boga, NP   5 mg at 06/28/14 1039  . hydrochlorothiazide (HYDRODIURIL) tablet 25 mg  25 mg Oral Daily Evelina Bucy, MD   25 mg at 06/28/14 1038  . ibuprofen (ADVIL,MOTRIN) tablet 600 mg  600 mg Oral Q8H PRN Evelina Bucy, MD      . insulin aspart (novoLOG) injection 0-15 Units  0-15 Units Subcutaneous TID WC Waylan Boga, NP   2 Units at 06/28/14 1313  . insulin aspart (novoLOG) injection 4 Units  4 Units Subcutaneous TID WC Waylan Boga, NP   4 Units at 06/28/14 1314  . LORazepam (ATIVAN) tablet 1 mg  1 mg Oral Q8H PRN Evelina Bucy, MD   1 mg at 06/26/14 2122  . menthol-cetylpyridinium (CEPACOL) lozenge 3 mg  1 lozenge Oral PRN Waylan Boga, NP      . nicotine (NICODERM CQ - dosed in mg/24  hours) patch 21 mg  21 mg Transdermal Daily Evelina Bucy, MD      . ondansetron [facility]) tablet 4 mg  4 mg Oral Q8H PRN Evelina Bucy, MD      . potassium chloride (K-DUR) CR tablet 10 mEq  10 mEq Oral Daily Evelina Bucy, MD   10 mEq at 06/28/14 1038  . QUEtiapine (SEROQUEL) tablet 100 mg  100 mg Oral QHS Evelina Bucy, MD   100 mg at 06/27/14 2148  . QUEtiapine (SEROQUEL) tablet 50 mg  50 mg Oral TID Waylan Boga, NP   50 mg at 06/28/14 1312  . traZODone (DESYREL) tablet 50 mg  50 mg Oral QHS,MR X 1 Laverle Hobby, PA-C   50 mg at 06/27/14 2149   Current Outpatient Prescriptions  Medication Sig Dispense Refill  . amLODipine (NORVASC) 5 MG tablet Take 10 mg by mouth at bedtime.      Marland Kitchen atorvastatin (LIPITOR) 20 MG tablet Take 20 mg by mouth at bedtime.      . cloNIDine (CATAPRES) 0.2 MG tablet Take 0.2 mg by mouth 2 (two) times daily.      . clopidogrel (PLAVIX) 75 MG tablet Take 75 mg by mouth daily.      Marland Kitchen diltiazem (CARDIZEM CD) 180 MG 24 hr capsule Take 360 mg by mouth 2 (two) times daily.      Marland Kitchen  glipiZIDE (GLUCOTROL) 5 MG tablet Take 5 mg by mouth 2 (two) times daily before a meal.      . hydrochlorothiazide (HYDRODIURIL) 25 MG tablet Take 1 tablet (25 mg total) by mouth daily.      . insulin lispro (HUMALOG) 100 UNIT/ML injection Inject 2-12 Units into the skin 3 (three) times daily before meals. Sliding Scale. 151-200=Take 2 units, 201-250=Take 4 units, 251-300=Take 6 units, 301-350=Take 8 units, 351-400=Take 10 units, Over 400=Take 12 units & Then Call Doctor.      Marland Kitchen LORazepam (ATIVAN) 1 MG tablet Take 1 mg by mouth every 6 (six) hours as needed for anxiety (anxiety).      . metoprolol (LOPRESSOR) 50 MG tablet Take 75 mg by mouth 2 (two) times daily.      . potassium chloride SA (K-DUR,KLOR-CON) 10 MEQ tablet Take 1 tablet (10 mEq total) by mouth daily.  30 tablet  0  . QUEtiapine (SEROQUEL) 100 MG tablet Take 1 tablet (100 mg total) by mouth at bedtime.  30 tablet  0  . QUEtiapine  (SEROQUEL) 50 MG tablet Take 1 tablet (50 mg total) by mouth 3 (three) times daily.  90 tablet  0    Psychiatric Specialty Exam:     Blood pressure 129/75, pulse 66, temperature 97.9 F (36.6 C), temperature source Oral, resp. rate 18, SpO2 97.00%.There is no height or weight on file to calculate BMI.  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  Waylan Boga, [place] 06/28/2014 2:21 PM  I have personally seen the patient and agreed with the findings and involved in the treatment plan. Berniece Andreas, MD

## 2014-06-28 NOTE — ED Notes (Signed)
Watching tv in day room

## 2014-06-28 NOTE — [facility] Suicide Risk Assessment (Signed)
Suicide Risk Assessment  Discharge Assessment     Demographic Factors:  Male  Total Time spent with patient: 30 minutes  Psychiatric Specialty Exam:     Blood pressure 129/75, pulse 66, temperature 97.9 F (36.6 C), temperature source Oral, resp. rate 18, SpO2 97.00%.There is no height or weight on file to calculate BMI.  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  Patients presenting with no risk factors but with morbid ruminations; may be classified as minimal risk based on the severity of the depressive symptoms  Discharge Diagnoses:   AXIS I:  Schizoaffective Disorder AXIS II:  Deferred AXIS III:   Past Medical History  Diagnosis Date  . Bipolar 1 disorder   . Schizophrenia   . Hypertension    AXIS IV:  other psychosocial or environmental problems and problems related to social  environment AXIS V:  61-70 mild symptoms  Plan Of Care/Follow-up recommendations:  Activity:  as tolerated Diet:  heart healthy diet  Is patient on multiple antipsychotic therapies at discharge:  No   Has Patient had three or more failed trials of antipsychotic monotherapy by history:  No  Recommended Plan for Multiple Antipsychotic Therapies: NA    LORD, JAMISON, PMH-NP 06/28/2014, 2:24 PM

## 2014-06-28 NOTE — ED Notes (Signed)
Report received from Janie Rambo RN. Pt. Sleeping, respirations regular and unlabored.  Will continue to monitor for safety. Q 15 minute checks continue. 

## 2014-06-29 DIAGNOSIS — F259 Schizoaffective disorder, unspecified: Secondary | ICD-10-CM | POA: Diagnosis not present

## 2014-06-29 DIAGNOSIS — F250 Schizoaffective disorder, bipolar type: Secondary | ICD-10-CM | POA: Insufficient documentation

## 2014-06-29 LAB — CBG MONITORING, ED: GLUCOSE-CAPILLARY: 77 mg/dL (ref 70–99)

## 2014-06-29 MED ORDER — TRAZODONE HCL 50 MG PO TABS: 50.0000 mg | ORAL_TABLET | Freq: Every day | ORAL | Status: DC

## 2014-06-29 MED ORDER — QUETIAPINE FUMARATE 50 MG PO TABS: 50.0000 mg | ORAL_TABLET | Freq: Three times a day (TID) | ORAL | Status: DC

## 2014-06-29 MED ORDER — AMLODIPINE BESYLATE 10 MG PO TABS: 10.0000 mg | ORAL_TABLET | Freq: Every day | ORAL | Status: DC

## 2014-06-29 MED ORDER — CARBAMAZEPINE 200 MG PO TABS: 200.0000 mg | ORAL_TABLET | Freq: Two times a day (BID) | ORAL | Status: DC

## 2014-06-29 NOTE — Progress Notes (Signed)
5:24pm. CSW met with Ms. Davis and pt. Ms. Rosana Hoes agrees to accept pt back to group home. Pt expressed concerns about working with staff members that were younger than him. He also had concerns about taking certain diabetes and blood pressure medicines, as well as having enough winter clothing and cigarettes. Ms. Rosana Hoes and CSW discussed these concerns and problem solved with pt.   Pt is ready for d/c and outpatient resources provided.  Rochele Pages,     ED CSW  phone: [phone]

## 2014-06-29 NOTE — ED Notes (Signed)
Nad, watching tv in day room.

## 2014-06-29 NOTE — Consult Note (Signed)
Psychiatry follow-up note   [name] is an 53 y.o. male. Total Time spent with patient: 30 minutes  Assessment: AXIS I:  Schizoaffective Disorder AXIS II:  Deferred AXIS III:   Past Medical History  Diagnosis Date  . Bipolar 1 disorder   . Schizophrenia   . Hypertension    AXIS IV:  other psychosocial or environmental problems, problems related to social environment and problems with primary support group AXIS V:  70; mild symptoms Plan:  Discharge back to his group home.    Subjective:   [name] is a 53 y.o. male patient does not warrant admission.  HPI: patient seen chart reviewed.  Patient is calm cooperative and taking his medication without side effects.  He has been cooperative and there has been no agitation or any anger.  He denies any suicidal thoughts or homicidal thoughts.  He wants to be discharged.  We are waiting from group home about his disposition.  Past Psychiatric History: Past Medical History  Diagnosis Date  . Bipolar 1 disorder   . Schizophrenia   . Hypertension     reports that he has never smoked. He does not have any smokeless tobacco history on file. He reports that he does not drink alcohol or use illicit drugs. History reviewed. No pertinent family history. Family History Substance Abuse: No Family Supports: No (None reported ) Living Arrangements: Other (Comment) (Resides at [facility] ) Can pt return to current living arrangement?: No Abuse/Neglect [facility]) Physical Abuse: Denies Verbal Abuse: Denies Sexual Abuse: Denies Allergies:  No Known Allergies  ACT Assessment Complete:  Yes:    Educational Status    Risk to Self: Risk to self with the past 6 months Suicidal Ideation: No Suicidal Intent: No Is patient at risk for suicide?: No Suicidal Plan?: No Access to Means: No What has been your use of drugs/alcohol within the last 12 months?: None  Previous Attempts/Gestures: No How many times?: 0 Other Self Harm  Risks: None  Triggers for Past Attempts: None known Intentional Self Injurious Behavior: None Family Suicide History: No Recent stressful life event(s): Other (Comment) (Chronic mental illness ) Persecutory voices/beliefs?: Yes Depression: No Depression Symptoms:  (None reported ) Substance abuse history and/or treatment for substance abuse?: No Suicide prevention information given to non-admitted patients: Not applicable  Risk to Others: Risk to Others within the past 6 months Homicidal Ideation: No Thoughts of Harm to Others: No Current Homicidal Intent: No Current Homicidal Plan: No Access to Homicidal Means: No Identified Victim: None  History of harm to others?: Yes Assessment of Violence: On admission Violent Behavior Description: Pt is verbally agressive and threatening towards staff Does patient have access to weapons?: No Criminal Charges Pending?: No Does patient have a court date: No  Abuse: Abuse/Neglect Assessment (Assessment to be complete while patient is alone) Physical Abuse: Denies Verbal Abuse: Denies Sexual Abuse: Denies Exploitation of patient/patient's resources: Denies Self-Neglect: Denies  Prior Inpatient Therapy: Prior Inpatient Therapy Prior Inpatient Therapy: Yes Prior Therapy Dates: 2006,2011 Prior Therapy Facilty/Provider(s): [facility]  Reason for Treatment: Schizophrenia   Prior Outpatient Therapy: Prior Outpatient Therapy Prior Outpatient Therapy: No Prior Therapy Dates: None  Prior Therapy Facilty/Provider(s): None  Reason for Treatment: None   Additional Information: Additional Information 1:1 In Past 12 Months?: No CIRT Risk: No Elopement Risk: No Does patient have medical clearance?: Yes                  Objective: Blood pressure 172/91, pulse  92, temperature 98.3 F (36.8 C), temperature source Oral, resp. rate 20, SpO2 100 %.There is no height or weight on file to calculate BMI. Results for orders placed or performed during  the hospital encounter of 06/25/14 (from the past 72 hour(s))  CBG monitoring, ED     Status: Abnormal   Collection Time: 06/28/14  9:40 AM  Result Value Ref Range   Glucose-Capillary 245 (H) 70 - 99 mg/dL  CBG monitoring, ED     Status: Abnormal   Collection Time: 06/28/14 12:44 PM  Result Value Ref Range   Glucose-Capillary 149 (H) 70 - 99 mg/dL  CBG monitoring, ED     Status: None   Collection Time: 06/28/14  5:15 PM  Result Value Ref Range   Glucose-Capillary 80 70 - 99 mg/dL   Labs are reviewed and are pertinent for no medical issues.  Current Facility-Administered Medications  Medication Dose Route Frequency Provider Last Rate Last Dose  . acetaminophen (TYLENOL) tablet 650 mg  650 mg Oral Q4H PRN Elwin MochaBlair Walden, MD      . alum & mag hydroxide-simeth (MAALOX/MYLANTA) 200-200-20 MG/5ML suspension 30 mL  30 mL Oral PRN Elwin MochaBlair Walden, MD      . amLODipine (NORVASC) tablet 10 mg  10 mg Oral Daily [name], NP   10 mg at 06/29/14 0951  . atorvastatin (LIPITOR) tablet 20 mg  20 mg Oral q1800 [name], NP   20 mg at 06/28/14 1840  . carbamazepine (TEGRETOL) tablet 200 mg  200 mg Oral BID PC Mojeed Akintayo   200 mg at 06/29/14 0951  . cloNIDine (CATAPRES) tablet 0.2 mg  0.2 mg Oral BID [name], NP   0.2 mg at 06/29/14 0952  . clopidogrel (PLAVIX) tablet 75 mg  75 mg Oral Daily [name], NP   75 mg at 06/29/14 0951  . glipiZIDE (GLUCOTROL) tablet 5 mg  5 mg Oral BID AC [name], NP   5 mg at 06/29/14 0808  . hydrochlorothiazide (HYDRODIURIL) tablet 25 mg  25 mg Oral Daily Elwin MochaBlair Walden, MD   25 mg at 06/29/14 0951  . ibuprofen (ADVIL,MOTRIN) tablet 600 mg  600 mg Oral Q8H PRN Elwin MochaBlair Walden, MD      . insulin aspart (novoLOG) injection 0-15 Units  0-15 Units Subcutaneous TID WC [name], NP   5 Units at 06/29/14 [phone]  . insulin aspart (novoLOG) injection 4 Units  4 Units Subcutaneous TID WC [name], NP   4 Units at 06/29/14 [phone]  . LORazepam (ATIVAN) tablet 1 mg  1  mg Oral Q8H PRN Elwin MochaBlair Walden, MD   1 mg at 06/26/14 2122  . menthol-cetylpyridinium (CEPACOL) lozenge 3 mg  1 lozenge Oral PRN [name], NP      . nicotine (NICODERM CQ - dosed in mg/24 hours) patch 21 mg  21 mg Transdermal Daily Elwin MochaBlair Walden, MD   21 mg at 06/25/14 2021  . ondansetron (ZOFRAN) tablet 4 mg  4 mg Oral Q8H PRN Elwin MochaBlair Walden, MD      . potassium chloride (K-DUR) CR tablet 10 mEq  10 mEq Oral Daily Elwin MochaBlair Walden, MD   10 mEq at 06/29/14 0951  . QUEtiapine (SEROQUEL) tablet 100 mg  100 mg Oral QHS Elwin MochaBlair Walden, MD   100 mg at 06/28/14 2155  . QUEtiapine (SEROQUEL) tablet 50 mg  50 mg Oral TID [name], NP   50 mg at 06/29/14 0808  . traZODone (DESYREL) tablet 50 mg  50 mg Oral  QHS,MR X 1 Kerry HoughSpencer E Simon, PA-C   50 mg at 06/28/14 2156   Current Outpatient Prescriptions  Medication Sig Dispense Refill  . amLODipine (NORVASC) 5 MG tablet Take 10 mg by mouth at bedtime.    Marland Kitchen. atorvastatin (LIPITOR) 20 MG tablet Take 20 mg by mouth at bedtime.    . cloNIDine (CATAPRES) 0.2 MG tablet Take 0.2 mg by mouth 2 (two) times daily.    . clopidogrel (PLAVIX) 75 MG tablet Take 75 mg by mouth daily.    Marland Kitchen. diltiazem (CARDIZEM CD) 180 MG 24 hr capsule Take 360 mg by mouth 2 (two) times daily.    Marland Kitchen. glipiZIDE (GLUCOTROL) 5 MG tablet Take 5 mg by mouth 2 (two) times daily before a meal.    . hydrochlorothiazide (HYDRODIURIL) 25 MG tablet Take 1 tablet (25 mg total) by mouth daily.    . insulin lispro (HUMALOG) 100 UNIT/ML injection Inject 2-12 Units into the skin 3 (three) times daily before meals. Sliding Scale. 151-200=Take 2 units, 201-250=Take 4 units, 251-300=Take 6 units, 301-350=Take 8 units, 351-400=Take 10 units, Over 400=Take 12 units & Then Call Doctor.    Marland Kitchen. LORazepam (ATIVAN) 1 MG tablet Take 1 mg by mouth every 6 (six) hours as needed for anxiety (anxiety).    . metoprolol (LOPRESSOR) 50 MG tablet Take 75 mg by mouth 2 (two) times daily.    . potassium chloride SA (K-DUR,KLOR-CON) 10 MEQ  tablet Take 1 tablet (10 mEq total) by mouth daily. 30 tablet 0  . QUEtiapine (SEROQUEL) 100 MG tablet Take 1 tablet (100 mg total) by mouth at bedtime. 30 tablet 0  . QUEtiapine (SEROQUEL) 50 MG tablet Take 1 tablet (50 mg total) by mouth 3 (three) times daily. 90 tablet 0    Psychiatric Specialty Exam:     Blood pressure 172/91, pulse 92, temperature 98.3 F (36.8 C), temperature source Oral, resp. rate 20, SpO2 100 %.There is no height or weight on file to calculate BMI.  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  Esterlene Atiyeh T.,

## 2014-06-29 NOTE — ED Notes (Signed)
Note--CB[facility] [id number]

## 2014-06-29 NOTE — ED Notes (Signed)
Written dc instructions and rx reviewed w/ patient and betty davis.  [name] verbalized understanding.  Pt encouraged to follow up as directed and follow up with his PMD concerning his lab work.  Pt ambulatory w/o difficulty to dc window w/ mHt and Ms. Davis.  Belongings returned after leaving the unit.

## 2014-06-29 NOTE — ED Notes (Signed)
Pt talking w/ [name]

## 2014-06-29 NOTE — ED Notes (Addendum)
Watching tv in the day room, nad, talking to his self at times, snack given

## 2014-06-29 NOTE — Discharge Instructions (Signed)
Schizoaffective Disorder Schizoaffective disorder (ScAD) is a mental illness. It causes symptoms that are a mixture of schizophrenia (a psychotic disorder) and an affective (mood) disorder. The schizophrenic symptoms may include delusions, hallucinations, or odd behavior. The mood symptoms may be similar to major depression or bipolar disorder. ScAD may interfere with personal relationships or normal daily activities. People with ScAD are at increased risk for job loss, social isolation,physical health problems, anxiety and substance use disorders, and suicide. ScAD usually occurs in cycles. Periods of severe symptoms are followed by periods of less severe symptoms or improvement. The illness affects men and women equally but usually appears at an earlier age (teenage or early adult years) in men. People who have family members with schizophrenia, bipolar disorder, or ScAD are at higher risk of developing ScAD. SYMPTOMS  At any one time, people with ScAD may have psychotic symptoms only or both psychotic and mood symptoms. The psychotic symptoms include one or more of the following:  Hearing, seeing, or feeling things that are not there (hallucinations).   Having fixed, false beliefs (delusions). The delusions usually are of being attacked, harassed, cheated, persecuted, or conspired against (paranoid delusions).  Speaking in a way that makes no sense to others (disorganized speech). The psychotic symptoms of ScAD may also include confusing or odd behavior or any of the negative symptoms of schizophrenia. These include loss of motivation for normal daily activities, such as bathing or grooming, withdrawal from other people, and lack of emotions.  The mood symptoms of ScAD occur more often than not. They resemble major depressive disorder or bipolar mania. Symptoms of major depression include depressed mood and four or more of the following:  Loss of interest in usually pleasurable activities  (anhedonia).  Sleeping more or less than normal.  Feeling worthless or excessively guilty.  Lack of energy or motivation.  Trouble concentrating.  Eating more or less than usual.  Thinking a lot about death or suicide. Symptoms of bipolar mania include abnormally elevated or irritable mood and increased energy or activity, plus three or more of the following:   More confidence than normal or feeling that you are able to do anything (grandiosity).  Feeling rested with less sleep than normal.   Being easily distracted.   Talking more than usual or feeling pressured to keep talking.   Feeling that your thoughts are racing.  Engaging in high-risk activities such as buying sprees or foolish business decisions. DIAGNOSIS  ScAD is diagnosed through an assessment by your health care provider. Your health care provider will observe and ask questions about your thoughts, behavior, mood, and ability to function in daily life. Your health care provider may also ask questions about your medical history and use of drugs, including prescription medicines. Your health care provider may also order blood tests and imaging exams. Certain medical conditions and substances can cause symptoms that resemble ScAD. Your health care provider may refer you to a mental health specialist for evaluation.  ScAD is divided into two types. The depressive type is diagnosed if your mood symptoms are limited to major depression. The bipolar type is diagnosed if your mood symptoms are manic or a mixture of manic and depressive symptoms TREATMENT  ScAD is usually a lifelong illness. Long-term treatment is necessary. The following treatments are available:  Medicine. Different types of medicine are used to treat ScAD. The exact combination depends on the type and severity of your symptoms. Antipsychotic medicine is used to control psychotic symptomssuch as delusions, paranoia,   and hallucinations. Mood stabilizers can  even the highs and lows of bipolar manic mood swings. Antidepressant medicines are used to treat major depressive symptoms.  Counseling or talk therapy. Individual, group, or family counseling may be helpful in providing education, support, and guidance. Many people with ScAD also benefit from social skills and job skills (vocational) training. A combination of medicine and counseling is usually best for managing the disorder over time. A procedure in which electricity is applied to the brain through the scalp (electroconvulsive therapy) may be used to treat people with severe manic symptoms that do not respond to medicine and counseling. HOME CARE INSTRUCTIONS   Take all your medicine as prescribed.  Check with your health care provider before starting new prescription or over-the-counter medicines.  Keep all follow up appointments with your health care provider. SEEK MEDICAL CARE IF:   If you are not able to take your medicines as prescribed.  If your symptoms get worse. SEEK IMMEDIATE MEDICAL CARE IF:   You have serious thoughts about hurting yourself or others. Document Released: 12/26/2006 Document Revised: 12/30/2013 Document Reviewed: 03/29/2013 Exit[employer] Patient Information 2015 [employer]. This information is not intended to replace advice given to you by your health care provider. Make sure you discuss any questions you have with your health care provider.  

## 2014-06-29 NOTE — ED Notes (Signed)
Talking w/ ms. davis

## 2014-06-29 NOTE — ED Notes (Signed)
Jinny SandersBetty Davis in to talk w/ pt--talking in the activity room

## 2014-06-29 NOTE — ED Notes (Signed)
Alex CSW talking w/ [name] and patient

## 2014-06-29 NOTE — ED Notes (Signed)
Up tot he bathroom to shower and change scrubs 

## 2014-06-29 NOTE — ED Notes (Signed)
Dr arfeen and shuvon into see 

## 2014-06-30 LAB — CBG MONITORING, ED: Glucose-Capillary: 211 mg/dL — ABNORMAL HIGH (ref 70–99)

## 2014-07-26 ENCOUNTER — Emergency Department (HOSPITAL_COMMUNITY)
Admission: EM | Admit: 2014-07-26 | Discharge: 2014-07-30 | Disposition: A | Discharge status: Home / Self Care | Payer: Medicare Other | Attending: Emergency Medicine | Admitting: Emergency Medicine

## 2014-07-26 ENCOUNTER — Encounter (HOSPITAL_COMMUNITY): Payer: Self-pay | Admitting: Emergency Medicine

## 2014-07-26 DIAGNOSIS — Z794 Long term (current) use of insulin: Secondary | ICD-10-CM | POA: Insufficient documentation

## 2014-07-26 DIAGNOSIS — F911 Conduct disorder, childhood-onset type: Secondary | ICD-10-CM | POA: Diagnosis present

## 2014-07-26 DIAGNOSIS — F319 Bipolar disorder, unspecified: Secondary | ICD-10-CM | POA: Diagnosis not present

## 2014-07-26 DIAGNOSIS — Z79899 Other long term (current) drug therapy: Secondary | ICD-10-CM | POA: Diagnosis not present

## 2014-07-26 DIAGNOSIS — Z7902 Long term (current) use of antithrombotics/antiplatelets: Secondary | ICD-10-CM | POA: Diagnosis not present

## 2014-07-26 DIAGNOSIS — I10 Essential (primary) hypertension: Secondary | ICD-10-CM | POA: Diagnosis not present

## 2014-07-26 DIAGNOSIS — F250 Schizoaffective disorder, bipolar type: Secondary | ICD-10-CM | POA: Diagnosis not present

## 2014-07-26 DIAGNOSIS — R7309 Other abnormal glucose: Secondary | ICD-10-CM | POA: Insufficient documentation

## 2014-07-26 LAB — COMPREHENSIVE METABOLIC PANEL
ALT: 43 U/L (ref 0–53)
AST: 41 U/L — ABNORMAL HIGH (ref 0–37)
Albumin: 2.9 g/dL — ABNORMAL LOW (ref 3.5–5.2)
Alkaline Phosphatase: 69 U/L (ref 39–117)
Anion gap: 14 (ref 5–15)
BUN: 22 mg/dL (ref 6–23)
CALCIUM: 8.6 mg/dL (ref 8.4–10.5)
CO2: 22 mEq/L (ref 19–32)
Chloride: 107 mEq/L (ref 96–112)
Creatinine, Ser: 2.6 mg/dL — ABNORMAL HIGH (ref 0.50–1.35)
GFR calc non Af Amer: 27 mL/min — ABNORMAL LOW (ref >90)
GFR, EST AFRICAN AMERICAN: 31 mL/min — AB (ref >90)
Glucose, Bld: 189 mg/dL — ABNORMAL HIGH (ref 70–99)
Potassium: 3.7 mEq/L (ref 3.7–5.3)
Sodium: 143 mEq/L (ref 137–147)
TOTAL PROTEIN: 6.4 g/dL (ref 6.0–8.3)
Total Bilirubin: 0.3 mg/dL (ref 0.3–1.2)

## 2014-07-26 LAB — RAPID URINE DRUG SCREEN, HOSP PERFORMED
Amphetamines: NOT DETECTED
Barbiturates: NOT DETECTED
Benzodiazepines: NOT DETECTED
Cocaine: NOT DETECTED
OPIATES: NOT DETECTED
TETRAHYDROCANNABINOL: NOT DETECTED

## 2014-07-26 LAB — SALICYLATE LEVEL: Salicylate Lvl: <2 mg/dL — ABNORMAL LOW (ref 2.8–20.0)

## 2014-07-26 LAB — CBC
HEMATOCRIT: 29.6 % — AB (ref 39.0–52.0)
HEMOGLOBIN: 9.8 g/dL — AB (ref 13.0–17.0)
MCH: 27 pg (ref 26.0–34.0)
MCHC: 33.1 g/dL (ref 30.0–36.0)
MCV: 81.5 fL (ref 78.0–100.0)
Platelets: 236 10*3/uL (ref 150–400)
RBC: 3.63 MIL/uL — ABNORMAL LOW (ref 4.22–5.81)
RDW: 13.9 % (ref 11.5–15.5)
WBC: 5.5 10*3/uL (ref 4.0–10.5)

## 2014-07-26 LAB — ETHANOL

## 2014-07-26 LAB — ACETAMINOPHEN LEVEL

## 2014-07-26 MED ORDER — CLOPIDOGREL BISULFATE 75 MG PO TABS
75.0000 mg | ORAL_TABLET | Freq: Every day | ORAL | Status: DC
  Administered 2014-07-28: 75 mg via ORAL
  Filled 2014-07-26 (×4): qty 1

## 2014-07-26 MED ORDER — AMLODIPINE BESYLATE 10 MG PO TABS
10.0000 mg | ORAL_TABLET | Freq: Every day | ORAL | Status: DC
  Administered 2014-07-28 – 2014-07-29 (×2): 10 mg via ORAL
  Filled 2014-07-26 (×4): qty 1

## 2014-07-26 MED ORDER — LORAZEPAM 1 MG PO TABS
2.0000 mg | ORAL_TABLET | Freq: Once | ORAL | Status: DC
  Filled 2014-07-26: qty 2

## 2014-07-26 MED ORDER — INSULIN ASPART 100 UNIT/ML ~~LOC~~ SOLN
0.0000 [IU] | Freq: Three times a day (TID) | SUBCUTANEOUS | Status: DC
Start: 2014-07-27 — End: 2014-07-30
  Filled 2014-07-26: qty 1

## 2014-07-26 MED ORDER — HYDROCHLOROTHIAZIDE 25 MG PO TABS
25.0000 mg | ORAL_TABLET | Freq: Every day | ORAL | Status: DC
  Administered 2014-07-28 – 2014-07-29 (×2): 25 mg via ORAL
  Filled 2014-07-26 (×4): qty 1

## 2014-07-26 MED ORDER — DILTIAZEM HCL ER COATED BEADS 180 MG PO CP24
180.0000 mg | ORAL_CAPSULE | Freq: Two times a day (BID) | ORAL | Status: DC
  Administered 2014-07-28 – 2014-07-29 (×3): 180 mg via ORAL
  Filled 2014-07-26 (×8): qty 1

## 2014-07-26 MED ORDER — HYDRALAZINE HCL 25 MG PO TABS
25.0000 mg | ORAL_TABLET | Freq: Three times a day (TID) | ORAL | Status: DC
  Administered 2014-07-28 – 2014-07-30 (×4): 25 mg via ORAL
  Filled 2014-07-26 (×12): qty 1

## 2014-07-26 MED ORDER — INSULIN LISPRO 100 UNIT/ML ~~LOC~~ SOLN: 2.0000 [IU] | Freq: Three times a day (TID) | SUBCUTANEOUS | Status: DC

## 2014-07-26 MED ORDER — QUETIAPINE FUMARATE 100 MG PO TABS
100.0000 mg | ORAL_TABLET | Freq: Every day | ORAL | Status: DC
  Administered 2014-07-26: 100 mg via ORAL
  Filled 2014-07-26: qty 1

## 2014-07-26 MED ORDER — QUETIAPINE FUMARATE 100 MG PO TABS
200.0000 mg | ORAL_TABLET | Freq: Every day | ORAL | Status: DC
  Administered 2014-07-28: 200 mg via ORAL
  Filled 2014-07-26: qty 2

## 2014-07-26 MED ORDER — TRAZODONE HCL 50 MG PO TABS
50.0000 mg | ORAL_TABLET | Freq: Every day | ORAL | Status: DC
  Administered 2014-07-28: 50 mg via ORAL
  Filled 2014-07-26: qty 1

## 2014-07-26 MED ORDER — LORAZEPAM 1 MG PO TABS: 1.0000 mg | ORAL_TABLET | Freq: Four times a day (QID) | ORAL | Status: DC | PRN

## 2014-07-26 MED ORDER — METOPROLOL TARTRATE 25 MG PO TABS
75.0000 mg | ORAL_TABLET | Freq: Two times a day (BID) | ORAL | Status: DC
  Administered 2014-07-28 (×2): 75 mg via ORAL
  Filled 2014-07-26 (×2): qty 3

## 2014-07-26 MED ORDER — GLIPIZIDE 5 MG PO TABS
5.0000 mg | ORAL_TABLET | Freq: Two times a day (BID) | ORAL | Status: DC
  Administered 2014-07-29 – 2014-07-30 (×2): 5 mg via ORAL
  Filled 2014-07-26 (×9): qty 1

## 2014-07-26 MED ORDER — QUETIAPINE FUMARATE 100 MG PO TABS
100.0000 mg | ORAL_TABLET | Freq: Every day | ORAL | Status: DC
  Administered 2014-07-28: 100 mg via ORAL
  Filled 2014-07-26 (×2): qty 1

## 2014-07-26 MED ORDER — LORAZEPAM 1 MG PO TABS: 1.0000 mg | ORAL_TABLET | Freq: Three times a day (TID) | ORAL | Status: DC | PRN

## 2014-07-26 MED ORDER — ACETAMINOPHEN 325 MG PO TABS
650.0000 mg | ORAL_TABLET | ORAL | Status: DC | PRN
  Filled 2014-07-26: qty 2

## 2014-07-26 MED ORDER — QUETIAPINE FUMARATE 100 MG PO TABS: 100.0000 mg | ORAL_TABLET | Freq: Two times a day (BID) | ORAL | Status: DC

## 2014-07-26 MED ORDER — ATORVASTATIN CALCIUM 10 MG PO TABS
10.0000 mg | ORAL_TABLET | Freq: Every day | ORAL | Status: DC
  Administered 2014-07-28: 10 mg via ORAL
  Filled 2014-07-26 (×4): qty 1

## 2014-07-26 MED ORDER — POTASSIUM CHLORIDE CRYS ER 10 MEQ PO TBCR
10.0000 meq | EXTENDED_RELEASE_TABLET | Freq: Every day | ORAL | Status: DC
  Administered 2014-07-28: 10 meq via ORAL
  Filled 2014-07-26 (×2): qty 1

## 2014-07-26 MED ORDER — CARBAMAZEPINE 200 MG PO TABS
200.0000 mg | ORAL_TABLET | Freq: Two times a day (BID) | ORAL | Status: DC
  Administered 2014-07-28 – 2014-07-29 (×2): 200 mg via ORAL
  Filled 2014-07-26 (×9): qty 1

## 2014-07-26 MED ORDER — CLONIDINE HCL 0.1 MG PO TABS
0.2000 mg | ORAL_TABLET | Freq: Two times a day (BID) | ORAL | Status: DC
  Administered 2014-07-28 (×2): 0.2 mg via ORAL
  Filled 2014-07-26 (×2): qty 2

## 2014-07-26 NOTE — ED Notes (Signed)
Pt given urinal for urine sample. Pt noted to turn head and speak, not to Clinical research associatewriter.

## 2014-07-26 NOTE — ED Notes (Signed)
Pt. To [facility] from ED ambulatory without difficulty, to room 43 . Report from Autumn RN. Pt. Is alert, warm and dry in no distress. Pt. Denies SI, HI, and AVH. Pt. Is loud and demanding, refusing medications. Pt. Encouraged to let Nursing staff know of any concerns or needs.

## 2014-07-26 NOTE — BH Assessment (Signed)
Unable to assess patient at this time due to patient being agitated and aggressive towards staff. TTS will attempt to assess patient when patient is calm and cooperative.

## 2014-07-26 NOTE — ED Notes (Signed)
Stevenson Clinchhandra Collins (group home staff)--[phone]

## 2014-07-26 NOTE — ED Provider Notes (Signed)
CSN: [id number]     Arrival date & time 07/26/14  1809 History   First MD Initiated Contact with Patient 07/26/14 2002     Chief Complaint  Patient presents with  . Group Home   . Aggressive Behavior     (Consider location/radiation/quality/duration/timing/severity/associated sxs/prior Treatment) HPI This is a 53 year old man with a history of bipolar disorder and schizoaffective disorder who comes in today with reports he has been very agitated and aggressive. Patient is a very poor historian with mumbling, rambling, incoherent history. Past Medical History  Diagnosis Date  . Bipolar 1 disorder   . Schizophrenia   . Hypertension    No past surgical history on file. No family history on file. History  Substance Use Topics  . Smoking status: Never Smoker   . Smokeless tobacco: Not on file  . Alcohol Use: No    Review of Systems  Unable to perform ROS     Allergies  Review of patient's allergies indicates no known allergies.  Home Medications   Prior to Admission medications   Medication Sig Start Date End Date Taking? Authorizing Provider  atorvastatin (LIPITOR) 10 MG tablet Take 10 mg by mouth at bedtime.   Yes Historical Provider, MD  hydrALAZINE (APRESOLINE) 25 MG tablet Take 25 mg by mouth 3 (three) times daily.   Yes Historical Provider, MD  QUEtiapine (SEROQUEL) 100 MG tablet Take 100-200 mg by mouth 2 (two) times daily. Take 1 tablet in the am & Take 2 tablets in the pm.   Yes Historical Provider, MD  amLODipine (NORVASC) 10 MG tablet Take 1 tablet (10 mg total) by mouth daily. 06/29/14   Shuvon Rankin, NP  carbamazepine (TEGRETOL) 200 MG tablet Take 1 tablet (200 mg total) by mouth 2 (two) times daily after a meal. 06/29/14   Shuvon Rankin, NP  cloNIDine (CATAPRES) 0.2 MG tablet Take 0.2 mg by mouth 2 (two) times daily.    Historical Provider, MD  clopidogrel (PLAVIX) 75 MG tablet Take 75 mg by mouth daily.    Historical Provider, MD  diltiazem (CARDIZEM CD) 180  MG 24 hr capsule Take 360 mg by mouth 2 (two) times daily.    Historical Provider, MD  glipiZIDE (GLUCOTROL) 5 MG tablet Take 5 mg by mouth 2 (two) times daily before a meal.    Historical Provider, MD  hydrochlorothiazide (HYDRODIURIL) 25 MG tablet Take 1 tablet (25 mg total) by mouth daily. 06/21/14   [name], NP  insulin lispro (HUMALOG) 100 UNIT/ML injection Inject 2-12 Units into the skin 3 (three) times daily before meals. Sliding Scale. 151-200=Take 2 units, 201-250=Take 4 units, 251-300=Take 6 units, 301-350=Take 8 units, 351-400=Take 10 units, Over 400=Take 12 units & Then Call Doctor.    Historical Provider, MD  LORazepam (ATIVAN) 1 MG tablet Take 1 mg by mouth every 6 (six) hours as needed for anxiety (anxiety).    Historical Provider, MD  metoprolol (LOPRESSOR) 50 MG tablet Take 75 mg by mouth 2 (two) times daily.    Historical Provider, MD  potassium chloride SA (K-DUR,KLOR-CON) 10 MEQ tablet Take 1 tablet (10 mEq total) by mouth daily. 06/21/14   [name], NP  QUEtiapine (SEROQUEL) 100 MG tablet Take 1 tablet (100 mg total) by mouth at bedtime. Patient not taking: Reported on 07/26/2014 06/21/14   [name], NP  QUEtiapine (SEROQUEL) 50 MG tablet Take 1 tablet (50 mg total) by mouth 3 (three) times daily. Patient not taking: Reported on 07/26/2014 06/21/14   [name],  NP  QUEtiapine (SEROQUEL) 50 MG tablet Take 1 tablet (50 mg total) by mouth 3 (three) times daily. Patient not taking: Reported on 07/26/2014 06/29/14   Shuvon Rankin, NP  traZODone (DESYREL) 50 MG tablet Take 1 tablet (50 mg total) by mouth at bedtime. 06/29/14   Shuvon Rankin, NP   There were no vitals taken for this visit. Physical Exam  Constitutional: He appears well-developed and well-nourished.  HENT:  Head: Normocephalic and atraumatic.  Neck: Normal range of motion.  Pulmonary/Chest: Effort normal.  Musculoskeletal: Normal range of motion.  Neurological: He is alert.  Psychiatric: His affect  is angry and inappropriate. His speech is rapid and/or pressured. He is agitated and aggressive.  Patient is not cooperative with exam or with history. Speech is very compressed and tangential he speaks very verbally aggressively towards the people at the group home and towards caregivers here in the emergency department.  Nursing note and vitals reviewed.   ED Course  Procedures (including critical care time) Labs Review Labs Reviewed  CBC - Abnormal; Notable for the following:    RBC 3.63 (*)    Hemoglobin 9.8 (*)    HCT 29.6 (*)    All other components within normal limits  COMPREHENSIVE METABOLIC PANEL - Abnormal; Notable for the following:    Glucose, Bld 189 (*)    Creatinine, Ser 2.60 (*)    Albumin 2.9 (*)    AST 41 (*)    GFR calc non Af Amer 27 (*)    GFR calc Af Amer 31 (*)    All other components within normal limits  SALICYLATE LEVEL - Abnormal; Notable for the following:    Salicylate Lvl <2.0 (*)    All other components within normal limits  ACETAMINOPHEN LEVEL  ETHANOL  URINE RAPID DRUG SCREEN (HOSP PERFORMED)    Imaging Review No results found.   EKG Interpretation None      MDM   Final diagnoses:  Schizoaffective disorder, bipolar type    1 agitation/aggression unclear secondary to bipolar disorder. Patient medicated here with Ativan and Seroquel. 2 renal insufficiency creatinine is today is 2.6 trend of creatinine goes back to 0.9 on 06/20/2014 prior to that it was 1.2 on 829.-  Patient needs follow up with nephrology.  3Blood sugar here is 189 no signs of DKA-would continue on home diabetic regimen.  4 anemia patient with hemoglobin gradually trending down- patient will need repeat cbc in 1-2 weeks as long as he is hemodynamically stable and has no signs of gi bleed.  Patient with normal vital signs here.  Plan transfer to psych ed for further evaluation by psych.    Hilario Quarryanielle S Criss Bartles, MD 07/26/14 2229

## 2014-07-26 NOTE — ED Notes (Signed)
MD at bedside. 

## 2014-07-26 NOTE — ED Notes (Signed)
TTS at bedside. 

## 2014-07-26 NOTE — ED Notes (Signed)
In to give pt ordered medications, pt became very upset, yelling cursing at staff. Arts development officerAccusing writer of attempting to give medications not ordered by MD. Attempted to explain to pt MD did in fact order meds. Pt refusing to take Ativan. Following Clinical research associatewriter out of room into hall.

## 2014-07-26 NOTE — ED Notes (Signed)
Pt sent from group home w/ [facility].  Pt was sent for aggressive behavior and not taking his meds.  Group home sent a green plastic cup of loose pills to have this nurse make patient take meds.  Meds thrown away.  Pt is talking to himself.  Group home states pt is psychotic.

## 2014-07-27 ENCOUNTER — Encounter (HOSPITAL_COMMUNITY): Payer: Self-pay | Admitting: Registered Nurse

## 2014-07-27 DIAGNOSIS — F39 Unspecified mood [affective] disorder: Secondary | ICD-10-CM

## 2014-07-27 DIAGNOSIS — F250 Schizoaffective disorder, bipolar type: Secondary | ICD-10-CM | POA: Diagnosis not present

## 2014-07-27 LAB — CBG MONITORING, ED
GLUCOSE-CAPILLARY: 151 mg/dL — AB (ref 70–99)
Glucose-Capillary: 105 mg/dL — ABNORMAL HIGH (ref 70–99)

## 2014-07-27 MED ORDER — LORAZEPAM 2 MG/ML IJ SOLN
2.0000 mg | Freq: Once | INTRAMUSCULAR | Status: AC
  Administered 2014-07-27: 2 mg via INTRAMUSCULAR
  Filled 2014-07-27: qty 1

## 2014-07-27 MED ORDER — ZIPRASIDONE MESYLATE 20 MG IM SOLR
20.0000 mg | Freq: Once | INTRAMUSCULAR | Status: AC
  Administered 2014-07-27: 20 mg via INTRAMUSCULAR
  Filled 2014-07-27: qty 20

## 2014-07-27 MED ORDER — DIPHENHYDRAMINE HCL 50 MG/ML IJ SOLN
50.0000 mg | Freq: Once | INTRAMUSCULAR | Status: AC
  Administered 2014-07-27: 50 mg via INTRAMUSCULAR
  Filled 2014-07-27: qty 1

## 2014-07-27 NOTE — Consult Note (Signed)
[facility] Consult   Reason for Consult:  Aggressive Behavior Referring Physician:  EDP  [name] is an 53 y.o. male. Total Time spent with patient: 45 minutes  Assessment: AXIS I:  Mood Disorder NOS AXIS II:  Deferred AXIS III:   Past Medical History  Diagnosis Date  . Bipolar 1 disorder   . Schizophrenia   . Hypertension    AXIS IV:  other psychosocial or environmental problems AXIS V:  41-50 serious symptoms  Plan:  Recommend psychiatric Inpatient admission when medically cleared.  Subjective:   [name] is a 53 y.o. male patient presents to [facility] from group home with complaints of refusing his medication.    HPI:  Patient refusing to take his medication, confusion, agitation, easily irritated.  During interview patient was easily agitated and stated that he was not taking medication from "[facility]; I'm older than they are."  Patient also states that he is not going back to group home.  Patient would not answer any question just rampage related to not want to take his medications and not wanting to go back to group home.   HPI Elements:   Location:  Aggressive behavior. Quality:  irritability. Severity:  Refusing medication. Timing:  1 day. Review of Systems  Unable to perform ROS: acuity of condition  Psychiatric/Behavioral: Positive for depression. Negative for suicidal ideas and substance abuse. The patient is nervous/anxious.        Aggressive behavior     Past Psychiatric History: Past Medical History  Diagnosis Date  . Bipolar 1 disorder   . Schizophrenia   . Hypertension     reports that he has never smoked. He does not have any smokeless tobacco history on file. He reports that he does not drink alcohol or use illicit drugs. History reviewed. No pertinent family history.         Allergies:  No Known Allergies  ACT Assessment Complete:  Yes:    Educational Status    Risk to Self: Risk to self with the past 6  months Is patient at risk for suicide?: No, but patient needs Medical Clearance (pt will not answer questions) Substance abuse history and/or treatment for substance abuse?: No  Risk to Others:    Abuse:    Prior Inpatient Therapy:    Prior Outpatient Therapy:    Additional Information:                    Objective: Blood pressure 150/84, pulse 92, temperature 98 F (36.7 C), temperature source Oral, resp. rate 18, SpO2 100 %.There is no height or weight on file to calculate BMI. Results for orders placed or performed during the hospital encounter of 07/26/14 (from the past 72 hour(s))  Acetaminophen level     Status: None   Collection Time: 07/26/14  6:55 PM  Result Value Ref Range   Acetaminophen (Tylenol), Serum <15.0 10 - 30 ug/mL    Comment:        THERAPEUTIC CONCENTRATIONS VARY SIGNIFICANTLY. A RANGE OF 10-30 ug/mL MAY BE AN EFFECTIVE CONCENTRATION FOR MANY PATIENTS. HOWEVER, SOME ARE BEST TREATED AT CONCENTRATIONS OUTSIDE THIS RANGE. ACETAMINOPHEN CONCENTRATIONS >150 ug/mL AT 4 HOURS AFTER INGESTION AND >50 ug/mL AT 12 HOURS AFTER INGESTION ARE OFTEN ASSOCIATED WITH TOXIC REACTIONS.   CBC     Status: Abnormal   Collection Time: 07/26/14  6:55 PM  Result Value Ref Range   WBC 5.5 4.0 - 10.5 K/uL   RBC 3.63 (  L) 4.22 - 5.81 MIL/uL   Hemoglobin 9.8 (L) 13.0 - 17.0 g/dL   HCT 29.6 (L) 39.0 - 52.0 %   MCV 81.5 78.0 - 100.0 fL   MCH 27.0 26.0 - 34.0 pg   MCHC 33.1 30.0 - 36.0 g/dL   RDW 13.9 11.5 - 15.5 %   Platelets 236 150 - 400 K/uL  Comprehensive metabolic panel     Status: Abnormal   Collection Time: 07/26/14  6:55 PM  Result Value Ref Range   Sodium 143 137 - 147 mEq/L   Potassium 3.7 3.7 - 5.3 mEq/L   Chloride 107 96 - 112 mEq/L   CO2 22 19 - 32 mEq/L   Glucose, Bld 189 (H) 70 - 99 mg/dL   BUN 22 6 - 23 mg/dL   Creatinine, Ser 2.60 (H) 0.50 - 1.35 mg/dL   Calcium 8.6 8.4 - 10.5 mg/dL   Total Protein 6.4 6.0 - 8.3 g/dL   Albumin 2.9 (L)  3.5 - 5.2 g/dL   AST 41 (H) 0 - 37 U/L   ALT 43 0 - 53 U/L   Alkaline Phosphatase 69 39 - 117 U/L   Total Bilirubin 0.3 0.3 - 1.2 mg/dL   GFR calc non Af Amer 27 (L) >90 mL/min   GFR calc Af Amer 31 (L) >90 mL/min    Comment: (NOTE) The eGFR has been calculated using the CKD EPI equation. This calculation has not been validated in all clinical situations. eGFR's persistently <90 mL/min signify possible Chronic Kidney Disease.    Anion gap 14 5 - 15  Ethanol (ETOH)     Status: None   Collection Time: 07/26/14  6:55 PM  Result Value Ref Range   Alcohol, Ethyl (B) <11 0 - 11 mg/dL    Comment:        LOWEST DETECTABLE LIMIT FOR SERUM ALCOHOL IS 11 mg/dL FOR MEDICAL PURPOSES ONLY   Salicylate level     Status: Abnormal   Collection Time: 07/26/14  6:55 PM  Result Value Ref Range   Salicylate Lvl <6.8 (L) 2.8 - 20.0 mg/dL  Urine Drug Screen     Status: None   Collection Time: 07/26/14  8:50 PM  Result Value Ref Range   Opiates NONE DETECTED NONE DETECTED   Cocaine NONE DETECTED NONE DETECTED   Benzodiazepines NONE DETECTED NONE DETECTED   Amphetamines NONE DETECTED NONE DETECTED   Tetrahydrocannabinol NONE DETECTED NONE DETECTED   Barbiturates NONE DETECTED NONE DETECTED    Comment:        DRUG SCREEN FOR MEDICAL PURPOSES ONLY.  IF CONFIRMATION IS NEEDED FOR ANY PURPOSE, NOTIFY LAB WITHIN 5 DAYS.        LOWEST DETECTABLE LIMITS FOR URINE DRUG SCREEN Drug Class       Cutoff (ng/mL) Amphetamine      1000 Barbiturate      200 Benzodiazepine   032 Tricyclics       122 Opiates          300 Cocaine          300 THC              50   CBG monitoring, ED     Status: Abnormal   Collection Time: 07/27/14  7:54 AM  Result Value Ref Range   Glucose-Capillary 105 (H) 70 - 99 mg/dL   Labs are reviewed see values above.  Medications reviewed  One time order for Geodon 20 mg/ Benadryl 50 mg/ Ativan 2  mg IM given  Current Facility-Administered Medications  Medication Dose Route  Frequency Provider Last Rate Last Dose  . acetaminophen (TYLENOL) tablet 650 mg  650 mg Oral Q4H PRN Shaune Pollack, MD      . amLODipine (NORVASC) tablet 10 mg  10 mg Oral Daily Shaune Pollack, MD   10 mg at 07/27/14 0801  . atorvastatin (LIPITOR) tablet 10 mg  10 mg Oral QHS Shaune Pollack, MD      . carbamazepine (TEGRETOL) tablet 200 mg  200 mg Oral BID PC Shaune Pollack, MD   200 mg at 07/27/14 0801  . cloNIDine (CATAPRES) tablet 0.2 mg  0.2 mg Oral BID Shaune Pollack, MD   0.2 mg at 07/26/14 2310  . clopidogrel (PLAVIX) tablet 75 mg  75 mg Oral Daily Shaune Pollack, MD   75 mg at 07/27/14 1054  . diltiazem (CARDIZEM CD) 24 hr capsule 180 mg  180 mg Oral BID Shaune Pollack, MD   180 mg at 07/27/14 1054  . glipiZIDE (GLUCOTROL) tablet 5 mg  5 mg Oral BID AC Shaune Pollack, MD   5 mg at 07/27/14 0800  . hydrALAZINE (APRESOLINE) tablet 25 mg  25 mg Oral TID Shaune Pollack, MD   25 mg at 07/27/14 0800  . hydrochlorothiazide (HYDRODIURIL) tablet 25 mg  25 mg Oral Daily Shaune Pollack, MD   25 mg at 07/27/14 1054  . insulin aspart (novoLOG) injection 0-15 Units  0-15 Units Subcutaneous TID WC Shaune Pollack, MD   0 Units at 07/27/14 0755  . LORazepam (ATIVAN) tablet 1 mg  1 mg Oral Q6H PRN Shaune Pollack, MD      . LORazepam (ATIVAN) tablet 2 mg  2 mg Oral Once Shaune Pollack, MD   2 mg at 07/26/14 2043  . metoprolol tartrate (LOPRESSOR) tablet 75 mg  75 mg Oral BID Shaune Pollack, MD   75 mg at 07/26/14 2310  . potassium chloride (K-DUR,KLOR-CON) CR tablet 10 mEq  10 mEq Oral Daily Shaune Pollack, MD   10 mEq at 07/27/14 1053  . QUEtiapine (SEROQUEL) tablet 100 mg  100 mg Oral Daily Shaune Pollack, MD   100 mg at 07/27/14 1053  . QUEtiapine (SEROQUEL) tablet 200 mg  200 mg Oral QHS Shaune Pollack, MD      . traZODone (DESYREL) tablet 50 mg  50 mg Oral QHS Shaune Pollack, MD   50 mg at 07/26/14 2311   Current Outpatient Prescriptions  Medication Sig Dispense Refill  . amLODipine (NORVASC) 5 MG  tablet Take 5 mg by mouth daily.    Marland Kitchen atorvastatin (LIPITOR) 10 MG tablet Take 10 mg by mouth at bedtime.    . benztropine (COGENTIN) 1 MG tablet Take 1 mg by mouth at bedtime.    . carbamazepine (TEGRETOL) 200 MG tablet Take 1 tablet (200 mg total) by mouth 2 (two) times daily after a meal. 60 tablet 0  . cloNIDine (CATAPRES) 0.2 MG tablet Take 0.2 mg by mouth 2 (two) times daily.    . clopidogrel (PLAVIX) 75 MG tablet Take 75 mg by mouth every morning.     Marland Kitchen glipiZIDE (GLUCOTROL) 5 MG tablet Take 5 mg by mouth 2 (two) times daily before a meal.    . hydrALAZINE (APRESOLINE) 25 MG tablet Take 25 mg by mouth 3 (three) times daily. Does not take systolic is >625    . insulin lispro (HUMALOG)  100 UNIT/ML injection Inject 2-12 Units into the skin 3 (three) times daily before meals. Sliding Scale. 151-200=Take 2 units, 201-250=Take 4 units, 251-300=Take 6 units, 301-350=Take 8 units, 351-400=Take 10 units, Over 400=Take 12 units & Then Call Doctor.    Marland Kitchen LORazepam (ATIVAN) 1 MG tablet Take 1 mg by mouth every 6 (six) hours as needed for anxiety (anxiety).    . metoprolol (LOPRESSOR) 50 MG tablet Take 75 mg by mouth 2 (two) times daily.    . potassium chloride SA (K-DUR,KLOR-CON) 10 MEQ tablet Take 1 tablet (10 mEq total) by mouth daily. (Patient taking differently: Take 10 mEq by mouth every morning. ) 30 tablet 0  . QUEtiapine (SEROQUEL) 100 MG tablet Take 100 mg by mouth 2 (two) times daily.     . risperiDONE microspheres (RISPERDAL CONSTA) 50 MG injection Inject 50 mg into the muscle every 14 (fourteen) days.    . traZODone (DESYREL) 50 MG tablet Take 1 tablet (50 mg total) by mouth at bedtime. 30 tablet 0    Psychiatric Specialty Exam:     Blood pressure 150/84, pulse 92, temperature 98 F (36.7 C), temperature source Oral, resp. rate 18, SpO2 100 %.There is no height or weight on file to calculate BMI.  General Appearance: Casual  Eye Contact::  Good  Speech:  Clear and Coherent and Normal  Rate  Volume:  Increased  Mood:  Angry, Anxious and Irritable  Affect:  Congruent  Thought Process:  Circumstantial and Disorganized  Orientation:  Full (Time, Place, and Person)  Thought Content:  Delusions, Paranoid Ideation and Rumination  Suicidal Thoughts:  No  Homicidal Thoughts:  No  Memory:  Immediate;   Fair Recent;   Fair Remote;   Fair  Judgement:  Impaired  Insight:  Lacking  Psychomotor Activity:  Normal  Concentration:  Poor  Recall:  [employer] of Knowledge:Fair  Language: Fair  Akathisia:  No  Handed:  Right  AIMS (if indicated):     Assets:  Communication Skills Desire for Improvement Housing  Sleep:      Musculoskeletal: Strength & Muscle Tone: within normal limits Gait & Station: normal Patient leans: N/A  Treatment Plan Summary: Daily contact with patient to assess and evaluate symptoms and progress in treatment Medication management Recommend inpatient treatment for stabilization  Rankin, Shuvon, FNP-BC 07/27/2014 1:31 PM  Patient seen, evaluated and I agree with notes by Nurse Practitioner. Corena Pilgrim, MD

## 2014-07-27 NOTE — Progress Notes (Signed)
Nursing note: pt has refused all his scheduled medications today. He refused his cbg at lunch, but took the cbg at 1700 and it was 151. His b/p is going up and was 178/100, due to him not taking his medications. rn explained to him that this would happen and he continues to refuse all scheduled medications.

## 2014-07-27 NOTE — ED Notes (Signed)
Pt. C/o urinating in his bed. Shower items given along with scrubs etc. Bed stripped and cleaned and remade.

## 2014-07-27 NOTE — ED Notes (Signed)
Patient refusing to have CBG taken at this time.

## 2014-07-27 NOTE — Progress Notes (Signed)
Nursing note; RN called pharmacy at [phone] and spoke with pharmacist requesting they do a medication reconciliation on this pt. She stated she would get this done. Pt is refusing meds stating they are "not right".

## 2014-07-27 NOTE — Progress Notes (Addendum)
Pharmacy - home medication clarifications  These were the changes made to the medication history:  Changed Amlodipine from 10mg  daily to 5mg  daily Discontinued Diltiazem Discontinued HCTZ Changed Seroquel from 200mg  QHS to 100mg  BID. Added Risperdal Consta, next dose due 07/28/14. Added Benztropine.  Please make these changes to the inpatient orders if appropriate. Call pharmacy if there are any questions.  Charolotte Ekeom Nayab Aten, PharmD, pager [phone]. 07/27/2014,10:11 PM.

## 2014-07-27 NOTE — BH Assessment (Signed)
Assessment Note  [name] is an 53 y.o. male. [facility] and [facility].  Patient has a history of ED visit for non-compliance with medication and aggression.  Patient is a poor historian.  Patient became aggressive and agitated during this interview, mumbling, rambling, and refusing to comply with treatment recommendations.  Patient refused medical and mental health medications. Patient banged on walls with anger as staff spoke with him about being stable on medical illness medications.    CSW consulted with Dr. Jannifer FranklinAkintayo it is recommended to refer for inpatient hospitalization for safety and stabilization.    Axis I: Schizoaffective Disorder Axis II: Deferred Axis III:  Past Medical History  Diagnosis Date  . Bipolar 1 disorder   . Schizophrenia   . Hypertension    Axis IV: housing problems, other psychosocial or environmental problems, problems related to social environment, problems with access to health care services and problems with primary support group Axis V: 21-30 behavior considerably influenced by delusions or hallucinations OR serious impairment in judgment, communication OR inability to function in almost all areas  Past Medical History:  Past Medical History  Diagnosis Date  . Bipolar 1 disorder   . Schizophrenia   . Hypertension     History reviewed. No pertinent past surgical history.  Family History: History reviewed. No pertinent family history.  Social History:  reports that he has never smoked. He does not have any smokeless tobacco history on file. He reports that he does not drink alcohol or use illicit drugs.  Additional Social History:     CIWA: CIWA-Ar BP: 150/84 mmHg Pulse Rate: 92 COWS:    Allergies: No Known Allergies  Home Medications:  (Not in a hospital admission)  OB/GYN Status:  No LMP for male patient.  General Assessment Data Location of Assessment: [facility] ACT Assessment: Yes Is this a Tele  or Face-to-Face Assessment?: Face-to-Face Is this an Initial Assessment or a Re-assessment for this encounter?: Initial Assessment Living Arrangements: Other (Comment) (group home) Can pt return to current living arrangement?:  (unknown) Admission Status: Involuntary Is patient capable of signing voluntary admission?: No Transfer from: Group Home Referral Source: Self/Family/Friend  Medical Screening Exam [facility] Walk-in ONLY) Medical Exam completed: Yes  [facility] Living Arrangements: Other (Comment) (group home)  Education Status Is patient currently in school?: No  Risk to self with the past 6 months Suicidal Ideation: No-Not Currently/Within Last 6 Months Suicidal Intent: No-Not Currently/Within Last 6 Months Is patient at risk for suicide?: No Suicidal Plan?: No-Not Currently/Within Last 6 Months Access to Means: No What has been your use of drugs/alcohol within the last 12 months?:  (none) Previous Attempts/Gestures: No Intentional Self Injurious Behavior: None Family Suicide History: Unknown Recent stressful life event(s): Loss (Comment), Conflict (Comment) Persecutory voices/beliefs?: Yes Depression:  (unable to assess) Depression Symptoms:  (unable to assess) Substance abuse history and/or treatment for substance abuse?: No  Risk to Others within the past 6 months Homicidal Ideation: No-Not Currently/Within Last 6 Months Thoughts of Harm to Others: No-Not Currently Present/Within Last 6 Months Current Homicidal Intent: No-Not Currently/Within Last 6 Months Current Homicidal Plan: No-Not Currently/Within Last 6 Months Access to Homicidal Means: No History of harm to others?: Yes Assessment of Violence: In past 6-12 months Violent Behavior Description: belligerent and threatening to staff Does patient have access to weapons?: No Criminal Charges Pending?:  (unable to assess)  Psychosis Hallucinations: Auditory (unable to assess, per previous  notes) Delusions:  Persecutory  Mental Status Report Appear/Hygiene: In hospital gown Eye Contact: Fair Motor Activity: Unremarkable Speech: Slurred, Loud, Tangential, Incoherent Level of Consciousness: Alert, Irritable Mood: Angry, Suspicious Affect: Blunted, Irritable, Labile Anxiety Level: Minimal Thought Processes: Tangential Judgement: Impaired Orientation: Place, Person, Situation Obsessive Compulsive Thoughts/Behaviors: None  Cognitive Functioning Concentration: Fair Memory: Remote Intact, Recent Intact IQ: Average Insight: Poor Impulse Control: Poor Appetite: Fair Sleep: No Change Vegetative Symptoms: None  ADLScreening [facility] Assessment Services) Patient's cognitive ability adequate to safely complete daily activities?: Yes Patient able to express need for assistance with ADLs?: Yes Independently performs ADLs?: Yes (appropriate for developmental age)  Prior Inpatient Therapy Prior Inpatient Therapy: Yes Prior Therapy Dates: 2006,2011 [facility]  Reason for Treatment: Schizophrenia      ADL Screening (condition at time of admission) Patient's cognitive ability adequate to safely complete daily activities?: Yes Patient able to express need for assistance with ADLs?: Yes Independently performs ADLs?: Yes (appropriate for developmental age)             Advance Directives (For Healthcare) Does patient have an advance directive?: No    Additional Information 1:1 In Past 12 Months?: No CIRT Risk: No Elopement Risk: No Does patient have medical clearance?: Yes     Disposition:  Disposition Initial Assessment Completed for this Encounter: Yes Disposition of Patient: Inpatient treatment program Type of inpatient treatment program: Adult  On Site Evaluation by:   Reviewed with Physician:    Maryelizabeth Rowanorbett, Estel Scholze A 07/27/2014 2:10 PM

## 2014-07-27 NOTE — Progress Notes (Signed)
12:16pm. CSW faxed IVC papers to magistrate. Per Juleen ChinaMagistrate Jenkins, papers have been received and approved and sheriff to be dispatched.   [name] LasterAlexandra Amadu [name] LCSWA,     ED CSW  phone: [phone]

## 2014-07-27 NOTE — ED Notes (Signed)
Pt. Asked for and given saltines and peanut butter.

## 2014-07-27 NOTE — ED Notes (Signed)
Nursing shift note: pt has been irritable, verbally aggressive and uncooperative most of the day. His admission here at this facility  has been changed from voluntary to an involuntary admission today. He had refused his scheduled medications twice and the MD order him Geodon, ativan and benadryl all by injection. It was administered and the pt calmed down. He refused his CBG at lunch. Pharmacy was called earlier in the shift to assist in reconciling his medications and the extender and MD was alerted. This pt mumbles, rambles, curses and continued to attempt to intimidate staff. RN will monitor and Q 15 min ck's continue.

## 2014-07-28 DIAGNOSIS — F250 Schizoaffective disorder, bipolar type: Secondary | ICD-10-CM | POA: Diagnosis not present

## 2014-07-28 LAB — CBG MONITORING, ED
GLUCOSE-CAPILLARY: 133 mg/dL — AB (ref 70–99)
Glucose-Capillary: 119 mg/dL — ABNORMAL HIGH (ref 70–99)
Glucose-Capillary: 93 mg/dL (ref 70–99)

## 2014-07-28 NOTE — BH Assessment (Signed)
[facility] Assessment Progress Note    The following Adult psych facilities were contacted in an attempt to place the pt:  Referral faxed for review: [facility] (beds available per [facility]) [facility] (beds available per Lloyd HugerNeil) [facility] (beds available per Victorino DikeJennifer)  At capacity:  [facility] (at capacity per Kipp BroodBrent) [facility] (at capacity per Alcario DroughtErica) [facility] (at capacity per Christiane HaJonathan) [facility] (at capacity per Sissy) [facility] (at capacity per Dennie BiblePat) [facility] (at capacity per Gunnar FusiPaula) [facility] (at capacity per Cala BradfordKimberly)  Not taking Aggressive patients: [facility]  [facility]  No answer: [facility]    TTS will continue to seek placement for the pt.  Beryle FlockMary Pawan Knechtel, MS, CRC, [facility] Licensed Professional Counselor Therapeutic Triage Specialist [facility] Phone: [phone] Fax: [phone]

## 2014-07-28 NOTE — ED Notes (Signed)
Pt. In shower. 

## 2014-07-28 NOTE — ED Notes (Signed)
Ice water given

## 2014-07-28 NOTE — ED Notes (Signed)
Patient has been intermittently irritable today.  He took all of his morning medications, but has refused medicine the rest of the day.  Stays in his room most of the time, but occasionally comes to the day room to watch TV.  Has not been a behavioral problem today.

## 2014-07-29 DIAGNOSIS — F250 Schizoaffective disorder, bipolar type: Secondary | ICD-10-CM | POA: Diagnosis not present

## 2014-07-29 DIAGNOSIS — F39 Unspecified mood [affective] disorder: Secondary | ICD-10-CM | POA: Diagnosis not present

## 2014-07-29 LAB — CBG MONITORING, ED
GLUCOSE-CAPILLARY: 184 mg/dL — AB (ref 70–99)
Glucose-Capillary: 150 mg/dL — ABNORMAL HIGH (ref 70–99)
Glucose-Capillary: 111 mg/dL — ABNORMAL HIGH (ref 70–99)
Glucose-Capillary: 157 mg/dL — ABNORMAL HIGH (ref 70–99)

## 2014-07-29 NOTE — Progress Notes (Signed)
  CARE MANAGEMENT ED NOTE 07/29/2014  Patient:  [name],[name]   Account Number:  [id number]  Date Initiated:  07/29/2014  Documentation initiated by:  Edd ArbourGIBBS,KIMBERLY  Subjective/Objective Assessment:   53 yr old medicare/medicaid covered [place] pt sent for aggressive behavior and not taking his meds.     Subjective/Objective Assessment Detail:   no pcp listed third ed visit in 6 months  dx mood disorder [facility]  bipolar 1 disorder, schizophrenia, HTN  Pt reported to be grumpy mood Pt confirmed to be in grumpy mood He reports to Cm he is upset cause "[name] number keeps being busy. I know she ain't busy."  Pt unable to tell CM name of pcp Refers CM to "[name]" at his group home  Pt reports having to take a lot of medications at [facility] sent a green plastic cup of loose pills to have this nurse make patient take meds.  Meds thrown away.  Pt is talking to himself.  Group home states pt is psychotic.           Action/Plan:   CM spoke with pt after [facility] about his behavior today.  CM spoke with pt with attempt to assess pcp name unsuccessful   Action/Plan Detail:   Anticipated DC Date:       Status Recommendation to Physician:   Result of Recommendation:    Other ED Services  Consult Working Plan    DC Planning Services  Other  Outpatient Services - Pt will follow up  PCP issues    Choice offered to / List presented to:            Status of service:  Completed, signed off  ED Comments:   ED Comments Detail:

## 2014-07-29 NOTE — ED Notes (Signed)
Patient refusing evening medications as well as insulin.  "You doctors and nurses are trying to kill me.My sugar is fine".  Gentle encouragement given.

## 2014-07-29 NOTE — ED Notes (Signed)
Patient refused his 2200 CBG.

## 2014-07-29 NOTE — Consult Note (Signed)
[place] [facility] Consult   Reason for Consult:  Aggressive Behavior Referring Physician:  EDP  [name] is an 53 y.o. male. Total Time spent with patient: 25 minutes  Assessment: AXIS I:  Mood Disorder NOS AXIS II:  Deferred AXIS III:   Past Medical History  Diagnosis Date  . Bipolar 1 disorder   . Schizophrenia   . Hypertension    AXIS IV:  other psychosocial or environmental problems AXIS V:  41-50 serious symptoms  Plan:  Recommend psychiatric Inpatient admission when medically cleared.  Subjective:   [name] is a 53 y.o. male patient presents to [facility] from group home with complaints of refusing his medication.  Pt seen and evaluated by Earleen Newport, NP. This NP helping with dictation and ED overflow. Per above provider, pt has started taking his medications. Therefore, pt can spend the night and be re-evaluated in the AM for consideration of discharge to outpatient services.   HPI:  Patient refusing to take his medication, confusion, agitation, easily irritated.  During interview patient was easily agitated and stated that he was not taking medication from "[facility]; I'm older than they are."  Patient also states that he is not going back to group home.  Patient would not answer any question just rampage related to not want to take his medications and not wanting to go back to group home.   HPI Elements:   Location:  Aggressive behavior. Quality:  irritability. Severity:  Refusing medication. Timing:  1 day. Review of Systems  Unable to perform ROS: acuity of condition  Psychiatric/Behavioral: Positive for depression. Negative for suicidal ideas and substance abuse. The patient is nervous/anxious.        Aggressive behavior     Past Psychiatric History: Past Medical History  Diagnosis Date  . Bipolar 1 disorder   . Schizophrenia   . Hypertension     reports that he has never smoked. He does not have any smokeless tobacco history on  file. He reports that he does not drink alcohol or use illicit drugs. History reviewed. No pertinent family history. Family History Substance Abuse: No Family Supports: No Living Arrangements: Other (Comment) (group home) Can pt return to current living arrangement?:  (unknown)   Allergies:  No Known Allergies  ACT Assessment Complete:  Yes:    Educational Status    Risk to Self: Risk to self with the past 6 months Suicidal Ideation: No-Not Currently/Within Last 6 Months Suicidal Intent: No-Not Currently/Within Last 6 Months Is patient at risk for suicide?: No Suicidal Plan?: No-Not Currently/Within Last 6 Months Access to Means: No What has been your use of drugs/alcohol within the last 12 months?:  (none) Previous Attempts/Gestures: No Intentional Self Injurious Behavior: None Family Suicide History: Unknown Recent stressful life event(s): Loss (Comment), Conflict (Comment) Persecutory voices/beliefs?: Yes Depression:  (unable to assess) Depression Symptoms:  (unable to assess) Substance abuse history and/or treatment for substance abuse?: No  Risk to Others: Risk to Others within the past 6 months Homicidal Ideation: No-Not Currently/Within Last 6 Months Thoughts of Harm to Others: No-Not Currently Present/Within Last 6 Months Current Homicidal Intent: No-Not Currently/Within Last 6 Months Current Homicidal Plan: No-Not Currently/Within Last 6 Months Access to Homicidal Means: No History of harm to others?: Yes Assessment of Violence: In past 6-12 months Violent Behavior Description: belligerent and threatening to staff Does patient have access to weapons?: No Criminal Charges Pending?:  (unable to assess)  Abuse:    Prior Inpatient Therapy:  Prior Inpatient Therapy Prior Inpatient Therapy: Yes Prior Therapy Dates: 2006,2011 Prior Therapy Facilty/Provider(s): [facility]  Reason for Treatment: Schizophrenia   Prior Outpatient Therapy:    Additional Information: Additional  Information 1:1 In Past 12 Months?: No CIRT Risk: No Elopement Risk: No Does patient have medical clearance?: Yes                  Objective: Blood pressure 120/65, pulse 72, temperature 98.2 F (36.8 C), temperature source Oral, resp. rate 18, SpO2 99 %.There is no height or weight on file to calculate BMI. Results for orders placed or performed during the hospital encounter of 07/26/14 (from the past 72 hour(s))  Acetaminophen level     Status: None   Collection Time: 07/26/14  6:55 PM  Result Value Ref Range   Acetaminophen (Tylenol), Serum <15.0 10 - 30 ug/mL    Comment:        THERAPEUTIC CONCENTRATIONS VARY SIGNIFICANTLY. A RANGE OF 10-30 ug/mL MAY BE AN EFFECTIVE CONCENTRATION FOR MANY PATIENTS. HOWEVER, SOME ARE BEST TREATED AT CONCENTRATIONS OUTSIDE THIS RANGE. ACETAMINOPHEN CONCENTRATIONS >150 ug/mL AT 4 HOURS AFTER INGESTION AND >50 ug/mL AT 12 HOURS AFTER INGESTION ARE OFTEN ASSOCIATED WITH TOXIC REACTIONS.   CBC     Status: Abnormal   Collection Time: 07/26/14  6:55 PM  Result Value Ref Range   WBC 5.5 4.0 - 10.5 K/uL   RBC 3.63 (L) 4.22 - 5.81 MIL/uL   Hemoglobin 9.8 (L) 13.0 - 17.0 g/dL   HCT 29.6 (L) 39.0 - 52.0 %   MCV 81.5 78.0 - 100.0 fL   MCH 27.0 26.0 - 34.0 pg   MCHC 33.1 30.0 - 36.0 g/dL   RDW 13.9 11.5 - 15.5 %   Platelets 236 150 - 400 K/uL  Comprehensive metabolic panel     Status: Abnormal   Collection Time: 07/26/14  6:55 PM  Result Value Ref Range   Sodium 143 137 - 147 mEq/L   Potassium 3.7 3.7 - 5.3 mEq/L   Chloride 107 96 - 112 mEq/L   CO2 22 19 - 32 mEq/L   Glucose, Bld 189 (H) 70 - 99 mg/dL   BUN 22 6 - 23 mg/dL   Creatinine, Ser 2.60 (H) 0.50 - 1.35 mg/dL   Calcium 8.6 8.4 - 10.5 mg/dL   Total Protein 6.4 6.0 - 8.3 g/dL   Albumin 2.9 (L) 3.5 - 5.2 g/dL   AST 41 (H) 0 - 37 U/L   ALT 43 0 - 53 U/L   Alkaline Phosphatase 69 39 - 117 U/L   Total Bilirubin 0.3 0.3 - 1.2 mg/dL   GFR calc non Af Amer 27 (L) >90  mL/min   GFR calc Af Amer 31 (L) >90 mL/min    Comment: (NOTE) The eGFR has been calculated using the CKD EPI equation. This calculation has not been validated in all clinical situations. eGFR's persistently <90 mL/min signify possible Chronic Kidney Disease.    Anion gap 14 5 - 15  Ethanol (ETOH)     Status: None   Collection Time: 07/26/14  6:55 PM  Result Value Ref Range   Alcohol, Ethyl (B) <11 0 - 11 mg/dL    Comment:        LOWEST DETECTABLE LIMIT FOR SERUM ALCOHOL IS 11 mg/dL FOR MEDICAL PURPOSES ONLY   Salicylate level     Status: Abnormal   Collection Time: 07/26/14  6:55 PM  Result Value Ref Range   Salicylate Lvl <2.9 (L) 2.8 -  20.0 mg/dL  Urine Drug Screen     Status: None   Collection Time: 07/26/14  8:50 PM  Result Value Ref Range   Opiates NONE DETECTED NONE DETECTED   Cocaine NONE DETECTED NONE DETECTED   Benzodiazepines NONE DETECTED NONE DETECTED   Amphetamines NONE DETECTED NONE DETECTED   Tetrahydrocannabinol NONE DETECTED NONE DETECTED   Barbiturates NONE DETECTED NONE DETECTED    Comment:        DRUG SCREEN FOR MEDICAL PURPOSES ONLY.  IF CONFIRMATION IS NEEDED FOR ANY PURPOSE, NOTIFY LAB WITHIN 5 DAYS.        LOWEST DETECTABLE LIMITS FOR URINE DRUG SCREEN Drug Class       Cutoff (ng/mL) Amphetamine      1000 Barbiturate      200 Benzodiazepine   086 Tricyclics       578 Opiates          300 Cocaine          300 THC              50   CBG monitoring, ED     Status: Abnormal   Collection Time: 07/27/14  7:54 AM  Result Value Ref Range   Glucose-Capillary 105 (H) 70 - 99 mg/dL  CBG monitoring, ED     Status: Abnormal   Collection Time: 07/27/14  5:28 PM  Result Value Ref Range   Glucose-Capillary 151 (H) 70 - 99 mg/dL  CBG monitoring, ED     Status: Abnormal   Collection Time: 07/28/14  8:09 AM  Result Value Ref Range   Glucose-Capillary 119 (H) 70 - 99 mg/dL  CBG monitoring, ED     Status: None   Collection Time: 07/28/14 12:48 PM   Result Value Ref Range   Glucose-Capillary 93 70 - 99 mg/dL  CBG monitoring, ED     Status: Abnormal   Collection Time: 07/28/14  6:00 PM  Result Value Ref Range   Glucose-Capillary 133 (H) 70 - 99 mg/dL  CBG monitoring, ED     Status: Abnormal   Collection Time: 07/29/14  9:05 AM  Result Value Ref Range   Glucose-Capillary 150 (H) 70 - 99 mg/dL   Comment 1 Notify RN   CBG monitoring, ED     Status: Abnormal   Collection Time: 07/29/14 12:49 PM  Result Value Ref Range   Glucose-Capillary 111 (H) 70 - 99 mg/dL   Comment 1 Notify RN    Labs are reviewed see values above.  Medications reviewed  One time order for Geodon 20 mg/ Benadryl 50 mg/ Ativan 2 mg IM given  Current Facility-Administered Medications  Medication Dose Route Frequency Provider Last Rate Last Dose  . acetaminophen (TYLENOL) tablet 650 mg  650 mg Oral Q4H PRN Shaune Pollack, MD      . amLODipine (NORVASC) tablet 10 mg  10 mg Oral Daily Shaune Pollack, MD   10 mg at 07/29/14 0915  . atorvastatin (LIPITOR) tablet 10 mg  10 mg Oral QHS Shaune Pollack, MD   10 mg at 07/28/14 2145  . carbamazepine (TEGRETOL) tablet 200 mg  200 mg Oral BID PC Shaune Pollack, MD   200 mg at 07/29/14 0915  . cloNIDine (CATAPRES) tablet 0.2 mg  0.2 mg Oral BID Shaune Pollack, MD   0.2 mg at 07/28/14 2145  . clopidogrel (PLAVIX) tablet 75 mg  75 mg Oral Daily Shaune Pollack, MD   75 mg at 07/28/14 4696  .  diltiazem (CARDIZEM CD) 24 hr capsule 180 mg  180 mg Oral BID Shaune Pollack, MD   180 mg at 07/29/14 0915  . glipiZIDE (GLUCOTROL) tablet 5 mg  5 mg Oral BID AC Shaune Pollack, MD   5 mg at 07/29/14 0920  . hydrALAZINE (APRESOLINE) tablet 25 mg  25 mg Oral TID Shaune Pollack, MD   25 mg at 07/29/14 0913  . hydrochlorothiazide (HYDRODIURIL) tablet 25 mg  25 mg Oral Daily Shaune Pollack, MD   25 mg at 07/29/14 0916  . insulin aspart (novoLOG) injection 0-15 Units  0-15 Units Subcutaneous TID WC Shaune Pollack, MD   0 Units at 07/27/14 0755  .  LORazepam (ATIVAN) tablet 1 mg  1 mg Oral Q6H PRN Shaune Pollack, MD      . LORazepam (ATIVAN) tablet 2 mg  2 mg Oral Once Shaune Pollack, MD   2 mg at 07/26/14 2043  . metoprolol tartrate (LOPRESSOR) tablet 75 mg  75 mg Oral BID Shaune Pollack, MD   75 mg at 07/28/14 2145  . potassium chloride (K-DUR,KLOR-CON) CR tablet 10 mEq  10 mEq Oral Daily Shaune Pollack, MD   10 mEq at 07/28/14 0906  . QUEtiapine (SEROQUEL) tablet 100 mg  100 mg Oral Daily Shaune Pollack, MD   100 mg at 07/28/14 0906  . QUEtiapine (SEROQUEL) tablet 200 mg  200 mg Oral QHS Shaune Pollack, MD   200 mg at 07/28/14 2146  . traZODone (DESYREL) tablet 50 mg  50 mg Oral QHS Shaune Pollack, MD   50 mg at 07/28/14 2146   Current Outpatient Prescriptions  Medication Sig Dispense Refill  . amLODipine (NORVASC) 5 MG tablet Take 5 mg by mouth daily.    Marland Kitchen atorvastatin (LIPITOR) 10 MG tablet Take 10 mg by mouth at bedtime.    . benztropine (COGENTIN) 1 MG tablet Take 1 mg by mouth at bedtime.    . carbamazepine (TEGRETOL) 200 MG tablet Take 1 tablet (200 mg total) by mouth 2 (two) times daily after a meal. 60 tablet 0  . cloNIDine (CATAPRES) 0.2 MG tablet Take 0.2 mg by mouth 2 (two) times daily.    . clopidogrel (PLAVIX) 75 MG tablet Take 75 mg by mouth every morning.     Marland Kitchen glipiZIDE (GLUCOTROL) 5 MG tablet Take 5 mg by mouth 2 (two) times daily before a meal.    . hydrALAZINE (APRESOLINE) 25 MG tablet Take 25 mg by mouth 3 (three) times daily. Does not take systolic is >938    . insulin lispro (HUMALOG) 100 UNIT/ML injection Inject 2-12 Units into the skin 3 (three) times daily before meals. Sliding Scale. 151-200=Take 2 units, 201-250=Take 4 units, 251-300=Take 6 units, 301-350=Take 8 units, 351-400=Take 10 units, Over 400=Take 12 units & Then Call Doctor.    Marland Kitchen LORazepam (ATIVAN) 1 MG tablet Take 1 mg by mouth every 6 (six) hours as needed for anxiety (anxiety).    . metoprolol (LOPRESSOR) 50 MG tablet Take 75 mg by mouth 2 (two)  times daily.    . potassium chloride SA (K-DUR,KLOR-CON) 10 MEQ tablet Take 1 tablet (10 mEq total) by mouth daily. (Patient taking differently: Take 10 mEq by mouth every morning. ) 30 tablet 0  . QUEtiapine (SEROQUEL) 100 MG tablet Take 100 mg by mouth 2 (two) times daily.     . risperiDONE microspheres (RISPERDAL CONSTA) 50 MG injection Inject 50 mg into the muscle every  14 (fourteen) days.    . traZODone (DESYREL) 50 MG tablet Take 1 tablet (50 mg total) by mouth at bedtime. 30 tablet 0    Psychiatric Specialty Exam:     Blood pressure 120/65, pulse 72, temperature 98.2 F (36.8 C), temperature source Oral, resp. rate 18, SpO2 99 %.There is no height or weight on file to calculate BMI.  General Appearance: Casual  Eye Contact::  Good  Speech:  Clear and Coherent and Normal Rate  Volume:  Increased  Mood:  Angry, Anxious and Irritable  Affect:  Congruent  Thought Process:  Circumstantial and Disorganized  Orientation:  Full (Time, Place, and Person)  Thought Content:  Delusions, Paranoid Ideation and Rumination  Suicidal Thoughts:  No  Homicidal Thoughts:  No  Memory:  Immediate;   Fair Recent;   Fair Remote;   Fair  Judgement:  Impaired  Insight:  Lacking  Psychomotor Activity:  Normal  Concentration:  Poor  Recall:  [employer] of Knowledge:Fair  Language: Fair  Akathisia:  No  Handed:  Right  AIMS (if indicated):     Assets:  Communication Skills Desire for Improvement Housing  Sleep:      Musculoskeletal: Strength & Muscle Tone: within normal limits Gait & Station: normal Patient leans: N/A  Treatment Plan Summary: -Hold for the night and be re-evaluated in the AM for consideration of discharge to outpatient services if stable.   Benjamine Mola, FNP-BC 07/29/2014 1:00 PM   Patient seen, evaluated and I agree with notes by Nurse Practitioner. Corena Pilgrim, MD

## 2014-07-30 DIAGNOSIS — F250 Schizoaffective disorder, bipolar type: Secondary | ICD-10-CM | POA: Diagnosis not present

## 2014-07-30 LAB — CBG MONITORING, ED
Glucose-Capillary: 146 mg/dL — ABNORMAL HIGH (ref 70–99)
Glucose-Capillary: 78 mg/dL (ref 70–99)

## 2014-07-30 MED ORDER — AMLODIPINE BESYLATE 10 MG PO TABS: 10.0000 mg | ORAL_TABLET | Freq: Every day | ORAL | Status: DC

## 2014-07-30 MED ORDER — TRAZODONE HCL 50 MG PO TABS: 50.0000 mg | ORAL_TABLET | Freq: Every day | ORAL | Status: AC

## 2014-07-30 MED ORDER — QUETIAPINE FUMARATE 100 MG PO TABS: 100.0000 mg | ORAL_TABLET | Freq: Every day | ORAL | Status: DC

## 2014-07-30 MED ORDER — GLIPIZIDE 5 MG PO TABS: 5.0000 mg | ORAL_TABLET | Freq: Two times a day (BID) | ORAL | Status: DC

## 2014-07-30 MED ORDER — INSULIN LISPRO 100 UNIT/ML ~~LOC~~ SOLN: 2.0000 [IU] | Freq: Three times a day (TID) | SUBCUTANEOUS | Status: DC

## 2014-07-30 MED ORDER — POTASSIUM CHLORIDE CRYS ER 10 MEQ PO TBCR: 10.0000 meq | EXTENDED_RELEASE_TABLET | Freq: Every day | ORAL | Status: AC

## 2014-07-30 MED ORDER — CLOPIDOGREL BISULFATE 75 MG PO TABS: 75.0000 mg | ORAL_TABLET | ORAL | Status: DC

## 2014-07-30 MED ORDER — CARBAMAZEPINE 200 MG PO TABS: 200.0000 mg | ORAL_TABLET | Freq: Two times a day (BID) | ORAL | Status: DC

## 2014-07-30 MED ORDER — ATORVASTATIN CALCIUM 10 MG PO TABS: 10.0000 mg | ORAL_TABLET | Freq: Every day | ORAL | Status: DC

## 2014-07-30 MED ORDER — QUETIAPINE FUMARATE 200 MG PO TABS: 200.0000 mg | ORAL_TABLET | Freq: Every day | ORAL | Status: DC

## 2014-07-30 MED ORDER — CLONIDINE HCL 0.2 MG PO TABS: 0.2000 mg | ORAL_TABLET | Freq: Two times a day (BID) | ORAL | Status: DC

## 2014-07-30 MED ORDER — RISPERIDONE MICROSPHERES 50 MG IM SUSR: 50.0000 mg | INTRAMUSCULAR | Status: DC

## 2014-07-30 MED ORDER — HYDRALAZINE HCL 25 MG PO TABS: 25.0000 mg | ORAL_TABLET | Freq: Three times a day (TID) | ORAL | Status: DC

## 2014-07-30 NOTE — Consult Note (Signed)
[facility] Face[facility]to[facility]Face [facility] Consult   Reason for Consult:  Aggressive Behavior Referring Physician:  EDP  [name] is an 53 y.o. male. Total Time spent with patient: 25 minutes  Assessment: AXIS I:  Mood Disorder NOS AXIS II:  Deferred AXIS III:   Past Medical History  Diagnosis Date  . Bipolar 1 disorder   . Schizophrenia   . Hypertension    AXIS IV:  other psychosocial or environmental problems AXIS V:  41-50 serious symptoms  Plan:  Recommend psychiatric Inpatient admission when medically cleared.  Subjective:   [name] is a 53 y.o. male [facility] with complaints of refusing his medication. HPI:  Patient continues to report that he is on too many medications but was compliant with taking about half of his medications for blood pressure and diabetes.  Patient reports he is feeling better today and is ready to go back to this group home.  Patient would like to follow up with his primary care doctor to reconcile medications.  Noted to be taking multiple medications for high blood pressure as well as blood sugar.  Patient much more calm and cooperative this am.  Denies homicidal or suicidal ideation.  Continues to verbalize paranoia regarding being poisoned by medications.  Patient willing to take if medications are individually presented so he can have control over which medications to take.  Patient states that he would enjoy being around people his own age. HPI Elements:   Location:  Aggressive behavior. Quality:  irritability. Severity:  Refusing medication. Timing:  Took half of prescribed medications.  Review of Systems  Constitutional: Negative.   HENT: Negative.   Eyes: Negative.   Respiratory: Positive for cough.   Cardiovascular: Negative.   Gastrointestinal: Negative.   Genitourinary: Negative.   Musculoskeletal: Negative.   Skin: Negative.   Neurological: Negative.   Endo/Heme/Allergies: Negative.    Psychiatric/Behavioral: Positive for depression. Negative for suicidal ideas and substance abuse. The patient is nervous/anxious.        Aggressive behavior     Past Psychiatric History: Past Medical History  Diagnosis Date  . Bipolar 1 disorder   . Schizophrenia   . Hypertension     reports that he old enough to smoke if he wants to, but denies use . He does not have any smokeless tobacco history on file. He reports that he does not drink alcohol or use illicit drugs. History reviewed. No pertinent family history. Family History Substance Abuse: No Family Supports: No Living Arrangements: Other (Comment) (group home) Can pt return to current living arrangement?:  (unknown)   Allergies:  No Known Allergies  ACT Assessment Complete:  Yes:    Educational Status    Risk to Self: Risk to self with the past 6 months Suicidal Ideation: No-Not Currently/Within Last 6 Months Suicidal Intent: No-Not Currently/Within Last 6 Months Is patient at risk for suicide?: No Suicidal Plan?: No-Not Currently/Within Last 6 Months Access to Means: No What has been your use of drugs/alcohol within the last 12 months?:  (none) Previous Attempts/Gestures: No Intentional Self Injurious Behavior: None Family Suicide History: Unknown Recent stressful life event(s): Loss (Comment), Conflict (Comment) Persecutory voices/beliefs?: Yes Depression:  (unable to assess) Depression Symptoms:  (unable to assess) Substance abuse history and/or treatment for substance abuse?: No  Risk to Others: Risk to Others within the past 6 months Homicidal Ideation: No-Not Currently/Within Last 6 Months Thoughts of Harm to Others: No-Not Currently Present/Within Last 6 Months Current Homicidal Intent:  No-Not Currently/Within Last 6 Months Current Homicidal Plan: No-Not Currently/Within Last 6 Months Access to Homicidal Means: No History of harm to others?: Yes Assessment of Violence: In past 6-12 months Violent  Behavior Description: belligerent and threatening to staff Does patient have access to weapons?: No Criminal Charges Pending?:  (unable to assess)  Abuse:    Prior Inpatient Therapy: Prior Inpatient Therapy Prior Inpatient Therapy: Yes Prior Therapy Dates: 2006,2011 [facility]  Reason for Treatment: Schizophrenia   Prior Outpatient Therapy:    Additional Information: Additional Information 1:1 In Past 12 Months?: No CIRT Risk: No Elopement Risk: No Does patient have medical clearance?: Yes   Objective: Blood pressure 119/63, pulse 82, temperature 98.1 F (36.7 C), temperature source Oral, resp. rate 18, SpO2 97 %.There is no height or weight on file to calculate BMI. Results for orders placed or performed during the hospital encounter of 07/26/14 (from the past 72 hour(s))  CBG monitoring, ED     Status: Abnormal   Collection Time: 07/27/14  5:28 PM  Result Value Ref Range   Glucose-Capillary 151 (H) 70 - 99 mg/dL  CBG monitoring, ED     Status: Abnormal   Collection Time: 07/28/14  8:09 AM  Result Value Ref Range   Glucose-Capillary 119 (H) 70 - 99 mg/dL  CBG monitoring, ED     Status: None   Collection Time: 07/28/14 12:48 PM  Result Value Ref Range   Glucose-Capillary 93 70 - 99 mg/dL  CBG monitoring, ED     Status: Abnormal   Collection Time: 07/28/14  6:00 PM  Result Value Ref Range   Glucose-Capillary 133 (H) 70 - 99 mg/dL  CBG monitoring, ED     Status: Abnormal   Collection Time: 07/29/14  9:05 AM  Result Value Ref Range   Glucose-Capillary 150 (H) 70 - 99 mg/dL   Comment 1 Notify RN   CBG monitoring, ED     Status: Abnormal   Collection Time: 07/29/14 12:49 PM  Result Value Ref Range   Glucose-Capillary 111 (H) 70 - 99 mg/dL   Comment 1 Notify RN   CBG monitoring, ED     Status: Abnormal   Collection Time: 07/29/14  5:37 PM  Result Value Ref Range   Glucose-Capillary 157 (H) 70 - 99 mg/dL   Comment 1 Notify RN   CBG monitoring,  ED     Status: Abnormal   Collection Time: 07/29/14 11:35 PM  Result Value Ref Range   Glucose-Capillary 184 (H) 70 - 99 mg/dL   Comment 1 Notify RN    Comment 2 Documented in Chart   CBG monitoring, ED     Status: Abnormal   Collection Time: 07/30/14  8:25 AM  Result Value Ref Range   Glucose-Capillary 146 (H) 70 - 99 mg/dL   Labs are reviewed see values above.  Medications reviewed  Noted to have multiple medications for HTN, diabetes.  These medications will need to be reconciled with primary care physician to avoid duplication  Current Facility-Administered Medications  Medication Dose Route Frequency Provider Last Rate Last Dose  . acetaminophen (TYLENOL) tablet 650 mg  650 mg Oral Q4H PRN Hilario Quarryanielle S Ray, MD      . amLODipine (NORVASC) tablet 10 mg  10 mg Oral Daily Hilario Quarryanielle S Ray, MD   10 mg at 07/29/14 0915  . atorvastatin (LIPITOR) tablet 10 mg  10 mg Oral QHS Hilario Quarryanielle S Ray, MD   10 mg at 07/28/14 2145  .  carbamazepine (TEGRETOL) tablet 200 mg  200 mg Oral BID PC Hilario Quarry, MD   200 mg at 07/29/14 0915  . cloNIDine (CATAPRES) tablet 0.2 mg  0.2 mg Oral BID Hilario Quarry, MD   0.2 mg at 07/28/14 2145  . clopidogrel (PLAVIX) tablet 75 mg  75 mg Oral Daily Hilario Quarry, MD   75 mg at 07/28/14 4098  . diltiazem (CARDIZEM CD) 24 hr capsule 180 mg  180 mg Oral BID Hilario Quarry, MD   180 mg at 07/29/14 0915  . glipiZIDE (GLUCOTROL) tablet 5 mg  5 mg Oral BID AC Hilario Quarry, MD   5 mg at 07/30/14 1191  . hydrALAZINE (APRESOLINE) tablet 25 mg  25 mg Oral TID Hilario Quarry, MD   25 mg at 07/30/14 0837  . hydrochlorothiazide (HYDRODIURIL) tablet 25 mg  25 mg Oral Daily Hilario Quarry, MD   25 mg at 07/29/14 0916  . insulin aspart (novoLOG) injection 0-15 Units  0-15 Units Subcutaneous TID WC Hilario Quarry, MD   0 Units at 07/27/14 0755  . LORazepam (ATIVAN) tablet 1 mg  1 mg Oral Q6H PRN Hilario Quarry, MD      . LORazepam (ATIVAN) tablet 2 mg  2 mg Oral Once Hilario Quarry, MD    2 mg at 07/26/14 2043  . metoprolol tartrate (LOPRESSOR) tablet 75 mg  75 mg Oral BID Hilario Quarry, MD   75 mg at 07/28/14 2145  . potassium chloride (K-DUR,KLOR-CON) CR tablet 10 mEq  10 mEq Oral Daily Hilario Quarry, MD   10 mEq at 07/28/14 0906  . QUEtiapine (SEROQUEL) tablet 100 mg  100 mg Oral Daily Hilario Quarry, MD   100 mg at 07/28/14 0906  . QUEtiapine (SEROQUEL) tablet 200 mg  200 mg Oral QHS Hilario Quarry, MD   200 mg at 07/28/14 2146  . traZODone (DESYREL) tablet 50 mg  50 mg Oral QHS Hilario Quarry, MD   50 mg at 07/28/14 2146   Current Outpatient Prescriptions  Medication Sig Dispense Refill  . amLODipine (NORVASC) 5 MG tablet Take 5 mg by mouth daily.    Marland Kitchen atorvastatin (LIPITOR) 10 MG tablet Take 10 mg by mouth at bedtime.    . benztropine (COGENTIN) 1 MG tablet Take 1 mg by mouth at bedtime.    . carbamazepine (TEGRETOL) 200 MG tablet Take 1 tablet (200 mg total) by mouth 2 (two) times daily after a meal. 60 tablet 0  . cloNIDine (CATAPRES) 0.2 MG tablet Take 0.2 mg by mouth 2 (two) times daily.    . clopidogrel (PLAVIX) 75 MG tablet Take 75 mg by mouth every morning.     Marland Kitchen glipiZIDE (GLUCOTROL) 5 MG tablet Take 5 mg by mouth 2 (two) times daily before a meal.    . hydrALAZINE (APRESOLINE) 25 MG tablet Take 25 mg by mouth 3 (three) times daily. Does not take systolic is >100    . insulin lispro (HUMALOG) 100 UNIT/ML injection Inject 2-12 Units into the skin 3 (three) times daily before meals. Sliding Scale. 151-200=Take 2 units, 201-250=Take 4 units, 251-300=Take 6 units, 301-350=Take 8 units, 351-400=Take 10 units, Over 400=Take 12 units & Then Call Doctor.    Marland Kitchen LORazepam (ATIVAN) 1 MG tablet Take 1 mg by mouth every 6 (six) hours as needed for anxiety (anxiety).    . metoprolol (LOPRESSOR) 50 MG tablet Take 75 mg by mouth 2 (two) times daily.    Marland Kitchen  potassium chloride SA (K-DUR,KLOR-CON) 10 MEQ tablet Take 1 tablet (10 mEq total) by mouth daily. (Patient taking differently:  Take 10 mEq by mouth every morning. ) 30 tablet 0  . QUEtiapine (SEROQUEL) 100 MG tablet Take 100 mg by mouth 2 (two) times daily.     . risperiDONE microspheres (RISPERDAL CONSTA) 50 MG injection Inject 50 mg into the muscle every 14 (fourteen) days.    . traZODone (DESYREL) 50 MG tablet Take 1 tablet (50 mg total) by mouth at bedtime. 30 tablet 0    Psychiatric Specialty Exam:     Blood pressure 119/63, pulse 82, temperature 98.1 F (36.7 C), temperature source Oral, resp. rate 18, SpO2 97 %.There is no height or weight on file to calculate BMI.  General Appearance: Casual  Eye Contact::  Good  Speech:   slightly garbled, normal rate, rhythm and prosody  Volume:  Increased  Mood: anxious, labile  Affect:  Congruent  Thought Process:  Circumstantial  Goal directed, with some tangentiality  Orientation:  Full (Time, Place, and Person)  Thought Content:  Delusions, Paranoid Ideation and Rumination  Suicidal Thoughts:  No  Homicidal Thoughts:  No  Memory:  Immediate;   Fair Recent;   Fair Remote;   Fair  Judgement:  Impaired  Insight:  Lacking  Psychomotor Activity:  Normal  Concentration:  Poor  Recall:  [employer] of Knowledge:Fair  Language: Fair  Akathisia:  No  Handed:  Right  AIMS (if indicated):     Assets:  Communication Skills Desire for Improvement Housing  Sleep:   States slept well.    Musculoskeletal: Strength & Muscle Tone: within normal limits Gait & Station: normal Patient leans: N/A  Treatment Plan Summary: Will discharge back to  [facility].  Patient needs appointment with primary care physician to reconcile  Management of patient's high blood pressure, dyslipidemia, and diabetes.  Continue on current psychotropic medications.  Follow up with outpatient psychiatrist.    Bonnetta Barry, PMHNP-BC  07/30/2014 10:34 AM    Patient seen, evaluated and I agree with notes by Nurse Practitioner. Thedore Mins, MD

## 2014-07-30 NOTE — [facility] Suicide Risk Assessment (Signed)
Suicide Risk Assessment  Discharge Assessment     Demographic Factors:  Male and Low socioeconomic status  Total Time spent with patient: 25 minutes  Psychiatric Specialty Exam:     Blood pressure 119/63, pulse 82, temperature 98.1 F (36.7 C), temperature source Oral, resp. rate 18, SpO2 97 %.There is no height or weight on file to calculate BMI.  General Appearance: Casual  Eye Contact::  Good  Speech:   slightly garbled, normal rate, rhythm and prosody  Volume:  Increased  Mood: anxious, labile  Affect:  Congruent  Thought Process:  Circumstantial  Goal directed, with some tangentiality  Orientation:  Full (Time, Place, and Person)  Thought Content:  Delusions, Paranoid Ideation and Rumination  Suicidal Thoughts:  No  Homicidal Thoughts:  No  Memory:  Immediate;   Fair Recent;   Fair Remote;   Fair  Judgement:  Impaired  Insight:  Lacking  Psychomotor Activity:  Normal  Concentration:  Poor  Recall:  [employer]  Fund of Knowledge:Fair  Language: Fair  Akathisia:  No  Handed:  Right  AIMS (if indicated):     Assets:  Communication Skills Desire for Improvement Housing  Sleep:   States slept well.    Musculoskeletal: Strength & Muscle Tone: within normal limits Gait & Station: normal Patient leans: N/A   Mental Status Per Nursing Assessment::   On Admission:    patient angry irritable, noncompliant with medications  Current Mental Status by Physician: denies homicidal or suicidal thoughts   Loss Factors: NA  Historical Factors: NA  Risk Reduction Factors:   Positive social support and Positive therapeutic relationship  Continued Clinical Symptoms:  More than one psychiatric diagnosis Previous Psychiatric Diagnoses and Treatments Medical Diagnoses and Treatments/Surgeries  Cognitive Features That Contribute To Risk:  Closed-mindedness    Suicide Risk:  Minimal: No identifiable suicidal ideation.  Patients presenting with no risk factors but with  morbid ruminations; may be classified as minimal risk based on the severity of the depressive symptoms  Discharge Diagnoses:   AXIS I:  Schizoaffective Disorder AXIS II:  Deferred AXIS III:   Past Medical History  Diagnosis Date  . Bipolar 1 disorder   . Schizophrenia   . Hypertension    AXIS IV:  other psychosocial or environmental problems and problems related to social environment AXIS V:  61-70 mild symptoms  Plan Of Care/Follow-up recommendations:  Activity:  as tolerated Diet:  heart healthy well balanced Other:  to follow up with primary care for managment of chronic medical issues  Is patient on multiple antipsychotic therapies at discharge:  Yes,   Do you recommend tapering to monotherapy for antipsychotics?  No   Has Patient had three or more failed trials of antipsychotic monotherapy by history: Patient currently prescribed  long acting injectable Risperdal  initiated in  November.  Patient also maintained on Seroquel 100 mg every am and 200 mg at bedtime.  Patient has history of noncompliance with oral medications.   Recommend cross taper  to long acting injectable to maximize symptom reduction, increase adherence Recommended Plan for Multiple Antipsychotic Therapies: Taper to monotherapy as described:  see above.     Bonnetta Barryisbach, Mills Mitton  PMH-NP  07/30/2014, 11:20 AM

## 2014-07-30 NOTE — Progress Notes (Signed)
Pt to be picked up at 330 by L[facility].   Byrd HesselbachKristen Cristina Mattern, LCSW [phone]  ED CSW 07/30/2014 1443pm

## 2014-07-30 NOTE — Progress Notes (Signed)
Per psychiatry patient psychiatrically stable for dc back to group home. Pt denies SI/HI/AH/VH. Pt asking to [facility]. CSW spoke with Jinny SandersBetty Davis, group home owner, who states patient can return. Ms. Earlene PlaterDavis states that she will ensure patient follows up with medical provider to evaluate patient blood pressures medications. CSW and Ms. Earlene PlaterDavis discussed spacing medications to discuss with patient medical doctor. Ms. Earlene PlaterDavis to pick up patient at approximately 2pm. Lupita LeashDonna to provide transportation, contact information is [phone]. CSW left message with Lupita LeashDonna to determine exact time.   Byrd HesselbachKristen Suhaylah Wampole, LCSW [phone]  ED CSW 07/30/2014 10:01 AM.

## 2014-08-19 ENCOUNTER — Encounter (HOSPITAL_COMMUNITY): Payer: Self-pay | Admitting: Emergency Medicine

## 2014-08-19 ENCOUNTER — Emergency Department (HOSPITAL_COMMUNITY)
Admission: EM | Admit: 2014-08-19 | Discharge: 2014-08-27 | Disposition: A | Discharge status: Home / Self Care | Payer: Medicare Other | Attending: Emergency Medicine | Admitting: Emergency Medicine

## 2014-08-19 DIAGNOSIS — F319 Bipolar disorder, unspecified: Secondary | ICD-10-CM | POA: Insufficient documentation

## 2014-08-19 DIAGNOSIS — F250 Schizoaffective disorder, bipolar type: Secondary | ICD-10-CM | POA: Diagnosis present

## 2014-08-19 DIAGNOSIS — F259 Schizoaffective disorder, unspecified: Secondary | ICD-10-CM

## 2014-08-19 DIAGNOSIS — Z7902 Long term (current) use of antithrombotics/antiplatelets: Secondary | ICD-10-CM | POA: Insufficient documentation

## 2014-08-19 DIAGNOSIS — R4689 Other symptoms and signs involving appearance and behavior: Secondary | ICD-10-CM

## 2014-08-19 DIAGNOSIS — Z794 Long term (current) use of insulin: Secondary | ICD-10-CM | POA: Diagnosis not present

## 2014-08-19 DIAGNOSIS — E119 Type 2 diabetes mellitus without complications: Secondary | ICD-10-CM

## 2014-08-19 DIAGNOSIS — F29 Unspecified psychosis not due to a substance or known physiological condition: Secondary | ICD-10-CM | POA: Diagnosis not present

## 2014-08-19 DIAGNOSIS — Z79899 Other long term (current) drug therapy: Secondary | ICD-10-CM | POA: Diagnosis not present

## 2014-08-19 DIAGNOSIS — I10 Essential (primary) hypertension: Secondary | ICD-10-CM | POA: Diagnosis not present

## 2014-08-19 DIAGNOSIS — F911 Conduct disorder, childhood-onset type: Secondary | ICD-10-CM | POA: Diagnosis not present

## 2014-08-19 DIAGNOSIS — Z046 Encounter for general psychiatric examination, requested by authority: Secondary | ICD-10-CM | POA: Insufficient documentation

## 2014-08-19 DIAGNOSIS — N189 Chronic kidney disease, unspecified: Secondary | ICD-10-CM

## 2014-08-19 LAB — ETHANOL

## 2014-08-19 LAB — COMPREHENSIVE METABOLIC PANEL
ALT: 51 U/L (ref 0–53)
ANION GAP: 12 (ref 5–15)
AST: 52 U/L — ABNORMAL HIGH (ref 0–37)
Albumin: 3.2 g/dL — ABNORMAL LOW (ref 3.5–5.2)
Alkaline Phosphatase: 60 U/L (ref 39–117)
BUN: 16 mg/dL (ref 6–23)
CO2: 19 mmol/L (ref 19–32)
CREATININE: 2.59 mg/dL — AB (ref 0.50–1.35)
Calcium: 8.5 mg/dL (ref 8.4–10.5)
Chloride: 109 mEq/L (ref 96–112)
GFR calc Af Amer: 31 mL/min — ABNORMAL LOW (ref >90)
GFR, EST NON AFRICAN AMERICAN: 27 mL/min — AB (ref >90)
GLUCOSE: 230 mg/dL — AB (ref 70–99)
Potassium: 3.7 mmol/L (ref 3.5–5.1)
SODIUM: 140 mmol/L (ref 135–145)
TOTAL PROTEIN: 6.7 g/dL (ref 6.0–8.3)
Total Bilirubin: 0.7 mg/dL (ref 0.3–1.2)

## 2014-08-19 LAB — RAPID URINE DRUG SCREEN, HOSP PERFORMED
AMPHETAMINES: NOT DETECTED
Barbiturates: NOT DETECTED
Benzodiazepines: NOT DETECTED
Cocaine: NOT DETECTED
OPIATES: NOT DETECTED
Tetrahydrocannabinol: NOT DETECTED

## 2014-08-19 LAB — CBC
HCT: 32 % — ABNORMAL LOW (ref 39.0–52.0)
HEMOGLOBIN: 10.5 g/dL — AB (ref 13.0–17.0)
MCH: 26.5 pg (ref 26.0–34.0)
MCHC: 32.8 g/dL (ref 30.0–36.0)
MCV: 80.8 fL (ref 78.0–100.0)
Platelets: 264 10*3/uL (ref 150–400)
RBC: 3.96 MIL/uL — AB (ref 4.22–5.81)
RDW: 14 % (ref 11.5–15.5)
WBC: 4.9 10*3/uL (ref 4.0–10.5)

## 2014-08-19 LAB — ACETAMINOPHEN LEVEL: Acetaminophen (Tylenol), Serum: <10 ug/mL — ABNORMAL LOW (ref 10–30)

## 2014-08-19 LAB — SALICYLATE LEVEL

## 2014-08-19 MED ORDER — ALUM & MAG HYDROXIDE-SIMETH 200-200-20 MG/5ML PO SUSP: 30.0000 mL | ORAL | Status: DC | PRN

## 2014-08-19 MED ORDER — ATORVASTATIN CALCIUM 10 MG PO TABS
10.0000 mg | ORAL_TABLET | Freq: Every day | ORAL | Status: DC
  Administered 2014-08-19: 10 mg via ORAL
  Filled 2014-08-19 (×9): qty 1

## 2014-08-19 MED ORDER — METOPROLOL TARTRATE 25 MG PO TABS
75.0000 mg | ORAL_TABLET | Freq: Two times a day (BID) | ORAL | Status: DC
  Administered 2014-08-19 – 2014-08-25 (×4): 75 mg via ORAL
  Filled 2014-08-19 (×6): qty 3

## 2014-08-19 MED ORDER — INSULIN ASPART 100 UNIT/ML ~~LOC~~ SOLN
0.0000 [IU] | Freq: Three times a day (TID) | SUBCUTANEOUS | Status: DC
  Administered 2014-08-20 (×2): 2 [IU] via SUBCUTANEOUS
  Filled 2014-08-19: qty 1

## 2014-08-19 MED ORDER — HYDRALAZINE HCL 25 MG PO TABS
25.0000 mg | ORAL_TABLET | Freq: Two times a day (BID) | ORAL | Status: DC
  Administered 2014-08-20 – 2014-08-27 (×6): 25 mg via ORAL
  Filled 2014-08-19 (×17): qty 1

## 2014-08-19 MED ORDER — CLOPIDOGREL BISULFATE 75 MG PO TABS
75.0000 mg | ORAL_TABLET | Freq: Every day | ORAL | Status: DC
  Filled 2014-08-19 (×8): qty 1

## 2014-08-19 MED ORDER — ONDANSETRON HCL 4 MG PO TABS: 4.0000 mg | ORAL_TABLET | Freq: Three times a day (TID) | ORAL | Status: DC | PRN

## 2014-08-19 MED ORDER — POTASSIUM CHLORIDE CRYS ER 10 MEQ PO TBCR
10.0000 meq | EXTENDED_RELEASE_TABLET | Freq: Every day | ORAL | Status: DC
  Administered 2014-08-19: 10 meq via ORAL
  Filled 2014-08-19 (×4): qty 1

## 2014-08-19 MED ORDER — GLIPIZIDE 5 MG PO TABS: 5.0000 mg | ORAL_TABLET | Freq: Two times a day (BID) | ORAL | Status: DC

## 2014-08-19 MED ORDER — LORAZEPAM 1 MG PO TABS
1.0000 mg | ORAL_TABLET | Freq: Four times a day (QID) | ORAL | Status: DC | PRN
  Filled 2014-08-19 (×2): qty 1

## 2014-08-19 MED ORDER — NICOTINE 21 MG/24HR TD PT24
21.0000 mg | MEDICATED_PATCH | Freq: Every day | TRANSDERMAL | Status: DC
  Filled 2014-08-19 (×4): qty 1

## 2014-08-19 MED ORDER — IBUPROFEN 200 MG PO TABS: 600.0000 mg | ORAL_TABLET | Freq: Three times a day (TID) | ORAL | Status: DC | PRN

## 2014-08-19 MED ORDER — INSULIN LISPRO 100 UNIT/ML ~~LOC~~ SOLN: 2.0000 [IU] | Freq: Three times a day (TID) | SUBCUTANEOUS | Status: DC

## 2014-08-19 MED ORDER — CLONIDINE HCL 0.1 MG PO TABS
0.2000 mg | ORAL_TABLET | Freq: Two times a day (BID) | ORAL | Status: DC
  Administered 2014-08-19 – 2014-08-25 (×5): 0.2 mg via ORAL
  Filled 2014-08-19 (×6): qty 2

## 2014-08-19 MED ORDER — INSULIN ASPART 100 UNIT/ML ~~LOC~~ SOLN: 0.0000 [IU] | Freq: Every day | SUBCUTANEOUS | Status: DC

## 2014-08-19 MED ORDER — QUETIAPINE FUMARATE 100 MG PO TABS
200.0000 mg | ORAL_TABLET | Freq: Every day | ORAL | Status: DC
  Filled 2014-08-19: qty 2

## 2014-08-19 MED ORDER — QUETIAPINE FUMARATE 100 MG PO TABS
100.0000 mg | ORAL_TABLET | Freq: Every day | ORAL | Status: DC
  Administered 2014-08-19: 100 mg via ORAL
  Filled 2014-08-19 (×2): qty 1

## 2014-08-19 MED ORDER — ZOLPIDEM TARTRATE 5 MG PO TABS: 5.0000 mg | ORAL_TABLET | Freq: Every evening | ORAL | Status: DC | PRN

## 2014-08-19 MED ORDER — AMLODIPINE BESYLATE 10 MG PO TABS
10.0000 mg | ORAL_TABLET | Freq: Every day | ORAL | Status: DC
  Administered 2014-08-20 – 2014-08-27 (×4): 10 mg via ORAL
  Filled 2014-08-19 (×8): qty 1

## 2014-08-19 MED ORDER — LORAZEPAM 1 MG PO TABS
1.0000 mg | ORAL_TABLET | Freq: Three times a day (TID) | ORAL | Status: DC | PRN
  Filled 2014-08-19: qty 1

## 2014-08-19 MED ORDER — CARBAMAZEPINE 200 MG PO TABS
200.0000 mg | ORAL_TABLET | Freq: Two times a day (BID) | ORAL | Status: DC
  Administered 2014-08-19: 200 mg via ORAL
  Filled 2014-08-19 (×18): qty 1

## 2014-08-19 MED ORDER — ACETAMINOPHEN 325 MG PO TABS: 650.0000 mg | ORAL_TABLET | ORAL | Status: DC | PRN

## 2014-08-19 MED ORDER — TRAZODONE HCL 50 MG PO TABS
50.0000 mg | ORAL_TABLET | Freq: Every day | ORAL | Status: DC
  Filled 2014-08-19: qty 1

## 2014-08-19 NOTE — BH Assessment (Signed)
Attempted to assess, however; patient in the restroom. Writer asked nursing staff to notify this Clinical research associatewriter when patient comes out the bathroom.

## 2014-08-19 NOTE — ED Notes (Signed)
Patient alert. Refusing all medications at present.

## 2014-08-19 NOTE — ED Notes (Signed)
Food/fluids offered.
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
## 2014-08-19 NOTE — Progress Notes (Signed)
CSW reached out to representative (Ms.Umberger) at [facility] to inquire if the patient has an ACT team. Also, CSW was interested to see if the patient still sees a therapist at the organization. However, representative did not answer the phone.   CSW left a voice message informing representative to call back.   Trish MageBrittney Damarian Priola, LCSWA [phone] ED CSW 08/19/2014 11:12 PM

## 2014-08-19 NOTE — ED Notes (Signed)
Patient pacing and irritable.  Not able to converse with this Clinical research associatewriter. Refusing food/fluids.

## 2014-08-19 NOTE — ED Notes (Addendum)
Pt refused vitals. Pt refused to have blood sugar checked.

## 2014-08-19 NOTE — ED Notes (Signed)
Patient remains drowsy. Not able to give ordered medications at this time.

## 2014-08-19 NOTE — ED Notes (Signed)
Patient is somewhat calmer but writer is still unable to assess psychosocial status.

## 2014-08-19 NOTE — Progress Notes (Signed)
EDCM discussed patient with EDSW regarding [facility] services.  EDSW will contact [facility] to inquire about patient active services and if patient has an ACT team member in the community.  No further EDCM needs at this time.
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
## 2014-08-20 ENCOUNTER — Encounter (HOSPITAL_COMMUNITY): Payer: Self-pay | Admitting: Registered Nurse

## 2014-08-20 DIAGNOSIS — F29 Unspecified psychosis not due to a substance or known physiological condition: Secondary | ICD-10-CM | POA: Diagnosis not present

## 2014-08-20 DIAGNOSIS — F3160 Bipolar disorder, current episode mixed, unspecified: Secondary | ICD-10-CM

## 2014-08-20 DIAGNOSIS — R4689 Other symptoms and signs involving appearance and behavior: Secondary | ICD-10-CM | POA: Insufficient documentation

## 2014-08-20 DIAGNOSIS — Z046 Encounter for general psychiatric examination, requested by authority: Secondary | ICD-10-CM | POA: Insufficient documentation

## 2014-08-20 LAB — CBG MONITORING, ED
Glucose-Capillary: 176 mg/dL — ABNORMAL HIGH (ref 70–99)
Glucose-Capillary: 102 mg/dL — ABNORMAL HIGH (ref 70–99)
Glucose-Capillary: 193 mg/dL — ABNORMAL HIGH (ref 70–99)

## 2014-08-20 MED ORDER — PALIPERIDONE ER 6 MG PO TB24
9.0000 mg | ORAL_TABLET | Freq: Every day | ORAL | Status: DC
  Filled 2014-08-20 (×8): qty 1

## 2014-08-20 MED ORDER — TRAZODONE HCL 50 MG PO TABS
150.0000 mg | ORAL_TABLET | Freq: Every day | ORAL | Status: DC
  Filled 2014-08-20 (×4): qty 1

## 2014-08-20 MED ORDER — BENZTROPINE MESYLATE 1 MG PO TABS
1.0000 mg | ORAL_TABLET | Freq: Every day | ORAL | Status: DC
  Filled 2014-08-20 (×3): qty 1

## 2014-08-20 NOTE — ED Notes (Signed)
Pt. Declined to have his vitals taken.

## 2014-08-20 NOTE — ED Notes (Signed)
Pt refuses to take any medications beside "my heart and sugar" medications. Pt asks to speak to provider about his medications. Pt states "I am a 53 year old man, I don't need to take the others." Pt's affect is flat, attitude is irritable and behavior is labile. Pt given emotional support. Pt offered a chance to ask questions and express concerns. Pt is stable and in no distress.

## 2014-08-20 NOTE — ED Notes (Addendum)
Pt's EKG completed. Placed in chart.   Shuvon Rankin, NP notified. EDP to read results.
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
## 2014-08-20 NOTE — ED Notes (Signed)
Patient in bed at present. Remains irritable, argumentative.
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
## 2014-08-21 ENCOUNTER — Encounter (HOSPITAL_COMMUNITY): Payer: Self-pay | Admitting: Registered Nurse

## 2014-08-21 DIAGNOSIS — Z046 Encounter for general psychiatric examination, requested by authority: Secondary | ICD-10-CM | POA: Diagnosis not present

## 2014-08-21 DIAGNOSIS — F39 Unspecified mood [affective] disorder: Secondary | ICD-10-CM | POA: Diagnosis not present

## 2014-08-21 LAB — CBG MONITORING, ED
GLUCOSE-CAPILLARY: 117 mg/dL — AB (ref 70–99)
Glucose-Capillary: 129 mg/dL — ABNORMAL HIGH (ref 70–99)

## 2014-08-21 NOTE — BHH Counselor (Addendum)
TTS Counselor called [facility] and was informed that pt is still on waitlist and that there would be no discharges until after Christmas.        Cyndie MullAnna Maliah Pyles, [facility] Triage Specialist [facility]

## 2014-08-21 NOTE — BH Assessment (Signed)
[facility] Assessment Progress Note  [facility]  At 11[facility]14 [facility] authorized referral from 08/21/2014 - 08/27/2014, authorization #[id number].  Please note that authorization does not mean that pt has been accepted to the facility.  I then called [facility] at 11:25 and [facility], who took verbal report and approved printed information to be faxed.  Documentation was then sent, and at 12:55 I spoke to [facility] who [facility] receipt.  At this time a decision is pending.  Doylene Canninghomas Latasia Silberstein, MA Triage Specialist 08/21/2014 @ 13:06

## 2014-08-21 NOTE — Consult Note (Signed)
[facility] Consult   Reason for Consult:  Aggressive Behavior Referring Physician:  EDP  Collie Wernick is an 53 y.o. male. Total Time spent with patient: 25 minutes  Assessment: AXIS I:  Mood Disorder NOS AXIS II:  Deferred AXIS III:   Past Medical History  Diagnosis Date  . Bipolar 1 disorder   . Schizophrenia   . Hypertension    AXIS IV:  other psychosocial or environmental problems AXIS V:  41-50 serious symptoms  Plan:  Recommend psychiatric Inpatient admission when medically cleared.  Subjective:   Clara Smolen is a 53 y.o. male patient presents to [facility] from group home with complaints of refusing his medication.  Pt seen and evaluated by Earleen Newport, NP. This NP helping with dictation and ED overflow. Per above provider, pt has started taking his medications. Therefore, pt can spend the night and be re-evaluated in the AM for consideration of discharge to outpatient services.    Today:  Patient state that he is feeling better.  "I'm just sitting here wating on another group home cause I'm tired of the cops messing with me."   Patient continues to get irritated easily when discussing his care and going home. Patient states that he does not want to take any medications other than the ones for his "blood pressure and sugar; that's all I need." and patient is refusing all other medications.  Patient states that he wants a different home related to the house being nasty, having roaches, and staff smoking in the house.  Patient denies suicidal/homicidal ideation, psychosis, and paranoia.     HPI Elements:   Location:  Aggressive behavior. Quality:  irritability. Severity:  Refusing medication. Timing:  1 day. Review of Systems  Unable to perform ROS: acuity of condition  Psychiatric/Behavioral: Positive for depression. Negative for suicidal ideas and substance abuse. The patient is nervous/anxious.        Aggressive behavior     Past Psychiatric  History: Past Medical History  Diagnosis Date  . Bipolar 1 disorder   . Schizophrenia   . Hypertension     reports that he has never smoked. He does not have any smokeless tobacco history on file. He reports that he does not drink alcohol or use illicit drugs. History reviewed. No pertinent family history. Family History Substance Abuse: No Family Supports: No Living Arrangements: Other (Comment) ([place]) Can pt return to current living arrangement?:  (unk)   Allergies:  No Known Allergies  ACT Assessment Complete:  Yes:    Educational Status    Risk to Self: Risk to self with the past 6 months Suicidal Ideation: No Suicidal Intent: No Is patient at risk for suicide?: No Suicidal Plan?: No Access to Means: No What has been your use of drugs/alcohol within the last 12 months?:  (n/a) Previous Attempts/Gestures: No How many times?:  (n/a) Other Self Harm Risks:  (n/a) Triggers for Past Attempts: None known Intentional Self Injurious Behavior: None Family Suicide History: Unknown Recent stressful life event(s): Other (Comment) (patient denies stressors) Persecutory voices/beliefs?: No Depression: No (pt denies ) Depression Symptoms: Feeling angry/irritable (patient appears angry and irritable although he denies ) Substance abuse history and/or treatment for substance abuse?: No Suicide prevention information given to non-admitted patients: Not applicable  Risk to Others: Risk to Others within the past 6 months Homicidal Ideation: No Thoughts of Harm to Others: No Current Homicidal Intent: No Current Homicidal Plan: No Access to Homicidal Means: No Identified Victim:  (n/a)  History of harm to others?: No Assessment of Violence: None Noted Violent Behavior Description:  (patient calm and cooperative ) Does patient have access to weapons?: No Criminal Charges Pending?: No Does patient have a court date: No  Abuse:    Prior Inpatient Therapy: Prior Inpatient  Therapy Prior Inpatient Therapy: Yes Prior Therapy Dates: 2006,2011 Prior Therapy Facilty/Provider(s): [facility]  Reason for Treatment: Schizophrenia   Prior Outpatient Therapy: Prior Outpatient Therapy Prior Outpatient Therapy: No Prior Therapy Dates: None  Prior Therapy Facilty/Provider(s): None  Reason for Treatment: None   Additional Information: Additional Information 1:1 In Past 12 Months?: No CIRT Risk: No Elopement Risk: No Does patient have medical clearance?: Yes                  Objective: Blood pressure 147/90, pulse 99, temperature 98.7 F (37.1 C), temperature source Oral, resp. rate 19, SpO2 98 %.There is no height or weight on file to calculate BMI. Results for orders placed or performed during the hospital encounter of 08/19/14 (from the past 72 hour(s))  Acetaminophen level     Status: Abnormal   Collection Time: 08/19/14  2:22 PM  Result Value Ref Range   Acetaminophen (Tylenol), Serum <10.0 (L) 10 - 30 ug/mL    Comment:        THERAPEUTIC CONCENTRATIONS VARY SIGNIFICANTLY. A RANGE OF 10-30 ug/mL MAY BE AN EFFECTIVE CONCENTRATION FOR MANY PATIENTS. HOWEVER, SOME ARE BEST TREATED AT CONCENTRATIONS OUTSIDE THIS RANGE. ACETAMINOPHEN CONCENTRATIONS >150 ug/mL AT 4 HOURS AFTER INGESTION AND >50 ug/mL AT 12 HOURS AFTER INGESTION ARE OFTEN ASSOCIATED WITH TOXIC REACTIONS.   CBC     Status: Abnormal   Collection Time: 08/19/14  2:22 PM  Result Value Ref Range   WBC 4.9 4.0 - 10.5 K/uL   RBC 3.96 (L) 4.22 - 5.81 MIL/uL   Hemoglobin 10.5 (L) 13.0 - 17.0 g/dL   HCT 32.0 (L) 39.0 - 52.0 %   MCV 80.8 78.0 - 100.0 fL   MCH 26.5 26.0 - 34.0 pg   MCHC 32.8 30.0 - 36.0 g/dL   RDW 14.0 11.5 - 15.5 %   Platelets 264 150 - 400 K/uL  Comprehensive metabolic panel     Status: Abnormal   Collection Time: 08/19/14  2:22 PM  Result Value Ref Range   Sodium 140 135 - 145 mmol/L    Comment: Please note change in reference range.   Potassium 3.7 3.5 - 5.1  mmol/L    Comment: Please note change in reference range.   Chloride 109 96 - 112 mEq/L   CO2 19 19 - 32 mmol/L   Glucose, Bld 230 (H) 70 - 99 mg/dL   BUN 16 6 - 23 mg/dL   Creatinine, Ser 2.59 (H) 0.50 - 1.35 mg/dL   Calcium 8.5 8.4 - 10.5 mg/dL   Total Protein 6.7 6.0 - 8.3 g/dL   Albumin 3.2 (L) 3.5 - 5.2 g/dL   AST 52 (H) 0 - 37 U/L   ALT 51 0 - 53 U/L   Alkaline Phosphatase 60 39 - 117 U/L   Total Bilirubin 0.7 0.3 - 1.2 mg/dL   GFR calc non Af Amer 27 (L) >90 mL/min   GFR calc Af Amer 31 (L) >90 mL/min    Comment: (NOTE) The eGFR has been calculated using the CKD EPI equation. This calculation has not been validated in all clinical situations. eGFR's persistently <90 mL/min signify possible Chronic Kidney Disease.    Anion gap 12 5 -  15  Ethanol (ETOH)     Status: None   Collection Time: 08/19/14  2:22 PM  Result Value Ref Range   Alcohol, Ethyl (B) <5 0 - 9 mg/dL    Comment:        LOWEST DETECTABLE LIMIT FOR SERUM ALCOHOL IS 11 mg/dL FOR MEDICAL PURPOSES ONLY   Salicylate level     Status: None   Collection Time: 08/19/14  2:22 PM  Result Value Ref Range   Salicylate Lvl <4.6 2.8 - 20.0 mg/dL  Urine Drug Screen     Status: None   Collection Time: 08/19/14  3:04 PM  Result Value Ref Range   Opiates NONE DETECTED NONE DETECTED   Cocaine NONE DETECTED NONE DETECTED   Benzodiazepines NONE DETECTED NONE DETECTED   Amphetamines NONE DETECTED NONE DETECTED   Tetrahydrocannabinol NONE DETECTED NONE DETECTED   Barbiturates NONE DETECTED NONE DETECTED    Comment:        DRUG SCREEN FOR MEDICAL PURPOSES ONLY.  IF CONFIRMATION IS NEEDED FOR ANY PURPOSE, NOTIFY LAB WITHIN 5 DAYS.        LOWEST DETECTABLE LIMITS FOR URINE DRUG SCREEN Drug Class       Cutoff (ng/mL) Amphetamine      1000 Barbiturate      200 Benzodiazepine   962 Tricyclics       952 Opiates          300 Cocaine          300 THC              50   CBG monitoring, ED     Status: Abnormal    Collection Time: 08/20/14  8:10 AM  Result Value Ref Range   Glucose-Capillary 176 (H) 70 - 99 mg/dL  CBG monitoring, ED     Status: Abnormal   Collection Time: 08/20/14 12:12 PM  Result Value Ref Range   Glucose-Capillary 102 (H) 70 - 99 mg/dL  CBG monitoring, ED     Status: Abnormal   Collection Time: 08/20/14  5:24 PM  Result Value Ref Range   Glucose-Capillary 193 (H) 70 - 99 mg/dL  CBG monitoring, ED     Status: Abnormal   Collection Time: 08/21/14  8:07 AM  Result Value Ref Range   Glucose-Capillary 117 (H) 70 - 99 mg/dL   Labs are reviewed see values above.  Medications reviewed  One time order for Geodon 20 mg/ Benadryl 50 mg/ Ativan 2 mg IM given  Current Facility-Administered Medications  Medication Dose Route Frequency Provider Last Rate Last Dose  . acetaminophen (TYLENOL) tablet 650 mg  650 mg Oral Q4H PRN Kristen N Ward, DO      . alum & mag hydroxide-simeth (MAALOX/MYLANTA) 200-200-20 MG/5ML suspension 30 mL  30 mL Oral PRN Kristen N Ward, DO      . amLODipine (NORVASC) tablet 10 mg  10 mg Oral Daily Kristen N Ward, DO   10 mg at 08/21/14 8413  . atorvastatin (LIPITOR) tablet 10 mg  10 mg Oral Daily Kristen N Ward, DO   10 mg at 08/19/14 1733  . benztropine (COGENTIN) tablet 1 mg  1 mg Oral Daily Mojeed Akintayo   1 mg at 08/20/14 1157  . carbamazepine (TEGRETOL) tablet 200 mg  200 mg Oral BID PC Kristen N Ward, DO   200 mg at 08/19/14 1732  . cloNIDine (CATAPRES) tablet 0.2 mg  0.2 mg Oral BID Kristen N Ward, DO   0.2 mg at 08/21/14  0924  . clopidogrel (PLAVIX) tablet 75 mg  75 mg Oral Daily Kristen N Ward, DO   75 mg at 08/20/14 1053  . hydrALAZINE (APRESOLINE) tablet 25 mg  25 mg Oral BID Kristen N Ward, DO   25 mg at 08/21/14 2458  . ibuprofen (ADVIL,MOTRIN) tablet 600 mg  600 mg Oral Q8H PRN Kristen N Ward, DO      . insulin aspart (novoLOG) injection 0-5 Units  0-5 Units Subcutaneous QHS Kristen N Ward, DO   0 Units at 08/20/14 0309  . insulin aspart (novoLOG)  injection 0-9 Units  0-9 Units Subcutaneous TID WC Kristen N Ward, DO   2 Units at 08/20/14 1828  . LORazepam (ATIVAN) tablet 1 mg  1 mg Oral Q8H PRN Kristen N Ward, DO      . LORazepam (ATIVAN) tablet 1 mg  1 mg Oral Q6H PRN Kristen N Ward, DO      . metoprolol tartrate (LOPRESSOR) tablet 75 mg  75 mg Oral BID Kristen N Ward, DO   75 mg at 08/20/14 0998  . nicotine (NICODERM CQ - dosed in mg/24 hours) patch 21 mg  21 mg Transdermal Daily Kristen N Ward, DO   21 mg at 08/19/14 1855  . ondansetron (ZOFRAN) tablet 4 mg  4 mg Oral Q8H PRN Kristen N Ward, DO      . paliperidone (INVEGA) 24 hr tablet 9 mg  9 mg Oral Daily Mojeed Akintayo   0 mg at 08/20/14 1740  . potassium chloride (K-DUR,KLOR-CON) CR tablet 10 mEq  10 mEq Oral Daily Kristen N Ward, DO   10 mEq at 08/19/14 1734  . traZODone (DESYREL) tablet 150 mg  150 mg Oral QHS Mojeed Akintayo   150 mg at 08/21/14 3382   Current Outpatient Prescriptions  Medication Sig Dispense Refill  . amLODipine (NORVASC) 10 MG tablet Take 1 tablet (10 mg total) by mouth daily.    Marland Kitchen atorvastatin (LIPITOR) 10 MG tablet Take 1 tablet (10 mg total) by mouth at bedtime.    . carbamazepine (TEGRETOL) 200 MG tablet Take 1 tablet (200 mg total) by mouth 2 (two) times daily after a meal. 60 tablet 0  . clopidogrel (PLAVIX) 75 MG tablet Take 1 tablet (75 mg total) by mouth every morning.    . hydrALAZINE (APRESOLINE) 25 MG tablet Take 1 tablet (25 mg total) by mouth 3 (three) times daily. Does not take systolic is >505 (Patient taking differently: Take 25 mg by mouth 2 (two) times daily. Does not take systolic is >397)    . LORazepam (ATIVAN) 1 MG tablet Take 1 mg by mouth every 6 (six) hours as needed for anxiety (anxiety).    . metoprolol (LOPRESSOR) 50 MG tablet Take 75 mg by mouth 2 (two) times daily.    . potassium chloride (K-DUR,KLOR-CON) 10 MEQ tablet Take 1 tablet (10 mEq total) by mouth daily.    . QUEtiapine (SEROQUEL) 200 MG tablet Take 1 tablet (200 mg  total) by mouth at bedtime. (Patient taking differently: Take 200 mg by mouth 2 (two) times daily. ) 30 tablet 0  . risperiDONE microspheres (RISPERDAL CONSTA) 50 MG injection Inject 2 mLs (50 mg total) into the muscle every 14 (fourteen) days. 1 each   . traZODone (DESYREL) 50 MG tablet Take 1 tablet (50 mg total) by mouth at bedtime. 30 tablet 0  . insulin aspart (NOVOLOG FLEXPEN) 100 UNIT/ML FlexPen Inject 2-10 Units into the skin 3 (three) times daily with meals. According  to Sliding Scale  If blood sugar level is: 151-200= 2 units; 201-250= 4 units; 251-300= 6 units; 301-350= 8 units and  If 351-400= 10 units      Psychiatric Specialty Exam:     Blood pressure 147/90, pulse 99, temperature 98.7 F (37.1 C), temperature source Oral, resp. rate 19, SpO2 98 %.There is no height or weight on file to calculate BMI.  General Appearance: Casual  Eye Contact::  Good  Speech:  Clear and Coherent and Normal Rate  Volume:  Increased  Mood:  Angry, Anxious and Irritable  Affect:  Congruent  Thought Process:  Circumstantial and Disorganized  Orientation:  Full (Time, Place, and Person)  Thought Content:  Paranoid Ideation and Rumination  Suicidal Thoughts:  No  Homicidal Thoughts:  No  Memory:  Immediate;   Fair Recent;   Fair Remote;   Fair  Judgement:  Impaired  Insight:  Lacking  Psychomotor Activity:  Normal  Concentration:  Poor  Recall:  [employer] of Knowledge:Fair  Language: Fair  Akathisia:  No  Handed:  Right  AIMS (if indicated):     Assets:  Communication Skills Desire for Improvement Housing  Sleep:      Musculoskeletal: Strength & Muscle Tone: within normal limits Gait & Station: normal Patient leans: N/A  Treatment Plan Summary: Inpatient treatment recommended for stabilization of mood.  Earleen Newport, FNP-BC 08/21/2014 11:08 AM   Patient seen in rounds and discussed with NP as above

## 2014-08-21 NOTE — ED Notes (Addendum)
Patient has been up and restless today.  Much pacing in the hallway.  Irritable at times, but he does not feel that he is irritable at all.  He declined all psych medicines this morning stating he would only take medicine for his high blood pressure and his diabetes.  He also declined one of his blood pressure medicines, metoprolol 75 mg.  Appetite is good.  He denies thoughts of harm to self or others.  He also denies auditory or visual hallucinations, but frequently seems to be responding to internal stimuli.  Not really interacting with peers on the unit.

## 2014-08-21 NOTE — ED Notes (Signed)
Patient declines medications at present. Restless, guarded. Appears to be responding to internal stimuli.   Encouragement offered. Continues to request snacks.  Q 15 safety checks continue.

## 2014-08-22 DIAGNOSIS — F39 Unspecified mood [affective] disorder: Secondary | ICD-10-CM | POA: Diagnosis not present

## 2014-08-22 DIAGNOSIS — Z046 Encounter for general psychiatric examination, requested by authority: Secondary | ICD-10-CM | POA: Diagnosis not present

## 2014-08-22 LAB — CBG MONITORING, ED
GLUCOSE-CAPILLARY: 140 mg/dL — AB (ref 70–99)
GLUCOSE-CAPILLARY: 174 mg/dL — AB (ref 70–99)
Glucose-Capillary: 133 mg/dL — ABNORMAL HIGH (ref 70–99)

## 2014-08-22 NOTE — ED Notes (Signed)
Up to the bathroom 

## 2014-08-22 NOTE — ED Notes (Addendum)
Sitting in day room watching tv.  Pt declined to take all medications except his clonidine, and became angry at being asked to take them.

## 2014-08-22 NOTE — BH Assessment (Signed)
[facility] Assessment Progress Note  [facility] [place] [facility] 09[facility].  Pt remains on wait list.  Doylene Canninghomas Perry Molla, MA Triage Specialist 08/22/2014 @ 09:45

## 2014-08-22 NOTE — ED Notes (Addendum)
Pt sitting in day room, pt declined to take his meds and became angry at being asked to take them again.  Pt back to his room to eat,

## 2014-08-22 NOTE — ED Notes (Signed)
Dr cobos and shuvon np into see 

## 2014-08-22 NOTE — Consult Note (Signed)
[name]   Reason for [name]:  Aggressive Behavior Referring Physician:  EDP  [name] is an 53 y.o. male. Total Time spent with patient: 25 minutes  Assessment: AXIS I:  Mood Disorder NOS AXIS II:  Deferred AXIS III:   Past Medical History  Diagnosis Date  . Bipolar 1 disorder   . Schizophrenia   . Hypertension    AXIS IV:  other psychosocial or environmental problems AXIS V:  41-50 serious symptoms  Plan:  Recommend psychiatric Inpatient admission when medically cleared.  Subjective:   [name] is a 53 y.o. male patient presents to [facility] from group home with complaints of refusing his medication.  Pt seen and evaluated by Earleen Newport, NP. This NP helping with dictation and ED overflow. Per above provider, pt has started taking his medications. Therefore, pt can spend the night and be re-evaluated in the AM for consideration of discharge to outpatient services.    Today: Patient sitting up in bed.  Patient states that he is feeling good.  "I'm just thinking about that little black; the yellow girl who was messing with my liver pills; somebody need to do something about that."  Patient then asked for some christmas candy "peppermint"     HPI Elements:   Location:  Aggressive behavior. Quality:  irritability. Severity:  Refusing medication. Timing:  1 day. Review of Systems  Unable to perform ROS: acuity of condition  Psychiatric/Behavioral: Positive for depression. Negative for suicidal ideas and substance abuse. The patient is nervous/anxious.        Aggressive behavior     Past Psychiatric History: Past Medical History  Diagnosis Date  . Bipolar 1 disorder   . Schizophrenia   . Hypertension     reports that he has never smoked. He does not have any smokeless tobacco history on file. He reports that he does not drink alcohol or use illicit drugs. History reviewed. No pertinent family history. Family History Substance  Abuse: No Family Supports: No Living Arrangements: Other (Comment) ([place]) Can pt return to current living arrangement?:  (unk)   Allergies:  No Known Allergies  ACT Assessment Complete:  Yes:    Educational Status    Risk to Self: Risk to self with the past 6 months Suicidal Ideation: No Suicidal Intent: No Is patient at risk for suicide?: No Suicidal Plan?: No Access to Means: No What has been your use of drugs/alcohol within the last 12 months?:  (n/a) Previous Attempts/Gestures: No How many times?:  (n/a) Other Self Harm Risks:  (n/a) Triggers for Past Attempts: None known Intentional Self Injurious Behavior: None Family Suicide History: Unknown Recent stressful life event(s): Other (Comment) (patient denies stressors) Persecutory voices/beliefs?: No Depression: No (pt denies ) Depression Symptoms: Feeling angry/irritable (patient appears angry and irritable although he denies ) Substance abuse history and/or treatment for substance abuse?: No Suicide prevention information given to non-admitted patients: Not applicable  Risk to Others: Risk to Others within the past 6 months Homicidal Ideation: No Thoughts of Harm to Others: No Current Homicidal Intent: No Current Homicidal Plan: No Access to Homicidal Means: No Identified Victim:  (n/a) History of harm to others?: No Assessment of Violence: None Noted Violent Behavior Description:  (patient calm and cooperative ) Does patient have access to weapons?: No Criminal Charges Pending?: No Does patient have a court date: No  Abuse:    Prior Inpatient Therapy: Prior Inpatient Therapy Prior Inpatient Therapy: Yes Prior Therapy Dates: [phone]  Prior Therapy Facilty/Provider(s): [facility]  Reason for Treatment: Schizophrenia   Prior Outpatient Therapy: Prior Outpatient Therapy Prior Outpatient Therapy: No Prior Therapy Dates: None  Prior Therapy Facilty/Provider(s): None  Reason for Treatment: None   Additional  Information: Additional Information 1:1 In Past 12 Months?: No CIRT Risk: No Elopement Risk: No Does patient have medical clearance?: Yes                  Objective: Blood pressure 132/92, pulse 97, temperature 98.1 F (36.7 C), temperature source Oral, resp. rate 15, SpO2 98 %.There is no height or weight on file to calculate BMI. Results for orders placed or performed during the hospital encounter of 08/19/14 (from the past 72 hour(s))  Acetaminophen level     Status: Abnormal   Collection Time: 08/19/14  2:22 PM  Result Value Ref Range   Acetaminophen (Tylenol), Serum <10.0 (L) 10 - 30 ug/mL    Comment:        THERAPEUTIC CONCENTRATIONS VARY SIGNIFICANTLY. A RANGE OF 10-30 ug/mL MAY BE AN EFFECTIVE CONCENTRATION FOR MANY PATIENTS. HOWEVER, SOME ARE BEST TREATED AT CONCENTRATIONS OUTSIDE THIS RANGE. ACETAMINOPHEN CONCENTRATIONS >150 ug/mL AT 4 HOURS AFTER INGESTION AND >50 ug/mL AT 12 HOURS AFTER INGESTION ARE OFTEN ASSOCIATED WITH TOXIC REACTIONS.   CBC     Status: Abnormal   Collection Time: 08/19/14  2:22 PM  Result Value Ref Range   WBC 4.9 4.0 - 10.5 K/uL   RBC 3.96 (L) 4.22 - 5.81 MIL/uL   Hemoglobin 10.5 (L) 13.0 - 17.0 g/dL   HCT 32.0 (L) 39.0 - 52.0 %   MCV 80.8 78.0 - 100.0 fL   MCH 26.5 26.0 - 34.0 pg   MCHC 32.8 30.0 - 36.0 g/dL   RDW 14.0 11.5 - 15.5 %   Platelets 264 150 - 400 K/uL  Comprehensive metabolic panel     Status: Abnormal   Collection Time: 08/19/14  2:22 PM  Result Value Ref Range   Sodium 140 135 - 145 mmol/L    Comment: Please note change in reference range.   Potassium 3.7 3.5 - 5.1 mmol/L    Comment: Please note change in reference range.   Chloride 109 96 - 112 mEq/L   CO2 19 19 - 32 mmol/L   Glucose, Bld 230 (H) 70 - 99 mg/dL   BUN 16 6 - 23 mg/dL   Creatinine, Ser 2.59 (H) 0.50 - 1.35 mg/dL   Calcium 8.5 8.4 - 10.5 mg/dL   Total Protein 6.7 6.0 - 8.3 g/dL   Albumin 3.2 (L) 3.5 - 5.2 g/dL   AST 52 (H) 0 - 37 U/L    ALT 51 0 - 53 U/L   Alkaline Phosphatase 60 39 - 117 U/L   Total Bilirubin 0.7 0.3 - 1.2 mg/dL   GFR calc non Af Amer 27 (L) >90 mL/min   GFR calc Af Amer 31 (L) >90 mL/min    Comment: (NOTE) The eGFR has been calculated using the CKD EPI equation. This calculation has not been validated in all clinical situations. eGFR's persistently <90 mL/min signify possible Chronic Kidney Disease.    Anion gap 12 5 - 15  Ethanol (ETOH)     Status: None   Collection Time: 08/19/14  2:22 PM  Result Value Ref Range   Alcohol, Ethyl (B) <5 0 - 9 mg/dL    Comment:        LOWEST DETECTABLE LIMIT FOR SERUM ALCOHOL IS 11 mg/dL FOR MEDICAL PURPOSES ONLY  Salicylate level     Status: None   Collection Time: 08/19/14  2:22 PM  Result Value Ref Range   Salicylate Lvl <1.7 2.8 - 20.0 mg/dL  Urine Drug Screen     Status: None   Collection Time: 08/19/14  3:04 PM  Result Value Ref Range   Opiates NONE DETECTED NONE DETECTED   Cocaine NONE DETECTED NONE DETECTED   Benzodiazepines NONE DETECTED NONE DETECTED   Amphetamines NONE DETECTED NONE DETECTED   Tetrahydrocannabinol NONE DETECTED NONE DETECTED   Barbiturates NONE DETECTED NONE DETECTED    Comment:        DRUG SCREEN FOR MEDICAL PURPOSES ONLY.  IF CONFIRMATION IS NEEDED FOR ANY PURPOSE, NOTIFY LAB WITHIN 5 DAYS.        LOWEST DETECTABLE LIMITS FOR URINE DRUG SCREEN Drug Class       Cutoff (ng/mL) Amphetamine      1000 Barbiturate      200 Benzodiazepine   408 Tricyclics       144 Opiates          300 Cocaine          300 THC              50   CBG monitoring, ED     Status: Abnormal   Collection Time: 08/20/14  8:10 AM  Result Value Ref Range   Glucose-Capillary 176 (H) 70 - 99 mg/dL  CBG monitoring, ED     Status: Abnormal   Collection Time: 08/20/14 12:12 PM  Result Value Ref Range   Glucose-Capillary 102 (H) 70 - 99 mg/dL  CBG monitoring, ED     Status: Abnormal   Collection Time: 08/20/14  5:24 PM  Result Value Ref Range    Glucose-Capillary 193 (H) 70 - 99 mg/dL  CBG monitoring, ED     Status: Abnormal   Collection Time: 08/21/14  8:07 AM  Result Value Ref Range   Glucose-Capillary 117 (H) 70 - 99 mg/dL  CBG monitoring, ED     Status: Abnormal   Collection Time: 08/21/14  6:27 PM  Result Value Ref Range   Glucose-Capillary 129 (H) 70 - 99 mg/dL  CBG monitoring, ED     Status: Abnormal   Collection Time: 08/22/14  6:59 AM  Result Value Ref Range   Glucose-Capillary 140 (H) 70 - 99 mg/dL   Labs are reviewed see values above.  Medications reviewed no changes made  Current Facility-Administered Medications  Medication Dose Route Frequency Provider Last Rate Last Dose  . acetaminophen (TYLENOL) tablet 650 mg  650 mg Oral Q4H PRN Kristen N Ward, DO      . alum & mag hydroxide-simeth (MAALOX/MYLANTA) 200-200-20 MG/5ML suspension 30 mL  30 mL Oral PRN Kristen N Ward, DO      . amLODipine (NORVASC) tablet 10 mg  10 mg Oral Daily Kristen N Ward, DO   10 mg at 08/21/14 8185  . atorvastatin (LIPITOR) tablet 10 mg  10 mg Oral Daily Kristen N Ward, DO   10 mg at 08/19/14 1733  . benztropine (COGENTIN) tablet 1 mg  1 mg Oral Daily Mojeed Akintayo   1 mg at 08/20/14 1157  . carbamazepine (TEGRETOL) tablet 200 mg  200 mg Oral BID PC Kristen N Ward, DO   200 mg at 08/19/14 1732  . cloNIDine (CATAPRES) tablet 0.2 mg  0.2 mg Oral BID Kristen N Ward, DO   0.2 mg at 08/22/14 6314  . clopidogrel (PLAVIX) tablet 75 mg  75 mg Oral Daily Kristen N Ward, DO   75 mg at 08/20/14 1053  . hydrALAZINE (APRESOLINE) tablet 25 mg  25 mg Oral BID Kristen N Ward, DO   25 mg at 08/21/14 9678  . ibuprofen (ADVIL,MOTRIN) tablet 600 mg  600 mg Oral Q8H PRN Kristen N Ward, DO      . insulin aspart (novoLOG) injection 0-5 Units  0-5 Units Subcutaneous QHS Kristen N Ward, DO   0 Units at 08/20/14 0309  . insulin aspart (novoLOG) injection 0-9 Units  0-9 Units Subcutaneous TID WC Kristen N Ward, DO   2 Units at 08/20/14 1828  . LORazepam (ATIVAN)  tablet 1 mg  1 mg Oral Q8H PRN Kristen N Ward, DO      . LORazepam (ATIVAN) tablet 1 mg  1 mg Oral Q6H PRN Kristen N Ward, DO      . metoprolol tartrate (LOPRESSOR) tablet 75 mg  75 mg Oral BID Kristen N Ward, DO   75 mg at 08/20/14 9381  . nicotine (NICODERM CQ - dosed in mg/24 hours) patch 21 mg  21 mg Transdermal Daily Kristen N Ward, DO   21 mg at 08/19/14 1855  . ondansetron (ZOFRAN) tablet 4 mg  4 mg Oral Q8H PRN Kristen N Ward, DO      . paliperidone (INVEGA) 24 hr tablet 9 mg  9 mg Oral Daily Mojeed Akintayo   0 mg at 08/20/14 1740  . potassium chloride (K-DUR,KLOR-CON) CR tablet 10 mEq  10 mEq Oral Daily Kristen N Ward, DO   10 mEq at 08/19/14 1734  . traZODone (DESYREL) tablet 150 mg  150 mg Oral QHS Mojeed Akintayo   150 mg at 08/21/14 0175   Current Outpatient Prescriptions  Medication Sig Dispense Refill  . amLODipine (NORVASC) 10 MG tablet Take 1 tablet (10 mg total) by mouth daily.    Marland Kitchen atorvastatin (LIPITOR) 10 MG tablet Take 1 tablet (10 mg total) by mouth at bedtime.    . carbamazepine (TEGRETOL) 200 MG tablet Take 1 tablet (200 mg total) by mouth 2 (two) times daily after a meal. 60 tablet 0  . clopidogrel (PLAVIX) 75 MG tablet Take 1 tablet (75 mg total) by mouth every morning.    . hydrALAZINE (APRESOLINE) 25 MG tablet Take 1 tablet (25 mg total) by mouth 3 (three) times daily. Does not take systolic is >102 (Patient taking differently: Take 25 mg by mouth 2 (two) times daily. Does not take systolic is >585)    . LORazepam (ATIVAN) 1 MG tablet Take 1 mg by mouth every 6 (six) hours as needed for anxiety (anxiety).    . metoprolol (LOPRESSOR) 50 MG tablet Take 75 mg by mouth 2 (two) times daily.    . potassium chloride (K-DUR,KLOR-CON) 10 MEQ tablet Take 1 tablet (10 mEq total) by mouth daily.    . QUEtiapine (SEROQUEL) 200 MG tablet Take 1 tablet (200 mg total) by mouth at bedtime. (Patient taking differently: Take 200 mg by mouth 2 (two) times daily. ) 30 tablet 0  .  risperiDONE microspheres (RISPERDAL CONSTA) 50 MG injection Inject 2 mLs (50 mg total) into the muscle every 14 (fourteen) days. 1 each   . traZODone (DESYREL) 50 MG tablet Take 1 tablet (50 mg total) by mouth at bedtime. 30 tablet 0  . insulin aspart (NOVOLOG FLEXPEN) 100 UNIT/ML FlexPen Inject 2-10 Units into the skin 3 (three) times daily with meals. According to Sliding Scale  If blood sugar level is:  151-200= 2 units; 201-250= 4 units; 251-300= 6 units; 301-350= 8 units and  If 351-400= 10 units      Psychiatric Specialty Exam:     Blood pressure 132/92, pulse 97, temperature 98.1 F (36.7 C), temperature source Oral, resp. rate 15, SpO2 98 %.There is no height or weight on file to calculate BMI.  General Appearance: Casual  Eye Contact::  Good  Speech:  Clear and Coherent and Normal Rate  Volume:  Increased  Mood:  Angry, Anxious and Irritable  Affect:  Congruent  Thought Process:  Circumstantial and Disorganized  Orientation:  Full (Time, Place, and Person)  Thought Content:  Paranoid Ideation and Rumination  Suicidal Thoughts:  No  Homicidal Thoughts:  No  Memory:  Immediate;   Fair Recent;   Fair Remote;   Fair  Judgement:  Impaired  Insight:  Lacking  Psychomotor Activity:  Normal  Concentration:  Poor  Recall:  [employer] of Knowledge:Fair  Language: Fair  Akathisia:  No  Handed:  Right  AIMS (if indicated):     Assets:  Communication Skills Desire for Improvement Housing  Sleep:      Musculoskeletal: Strength & Muscle Tone: within normal limits Gait & Station: normal Patient leans: N/A  Treatment Plan Summary: Will continue with current plan for inpatient treatment for mood stabilization.  Patient has been placed on [facility] wait list.    Patient has asked for assistance in finding another group home.   Earleen Newport, FNP-BC 08/22/2014 10:54 AM   Patient seen in rounds and discussed as above with NP

## 2014-08-22 NOTE — BHH Counselor (Signed)
Per Renee PainLee Harris, [facility] as of 20:10 on 12/25.   No discharges anticipated until after Christmas.       Cyndie MullAnna Noele Icenhour, [facility] Triage Specialist [facility]

## 2014-08-22 NOTE — ED Notes (Signed)
Patient declines ordered medications at present.

## 2014-08-23 DIAGNOSIS — Z046 Encounter for general psychiatric examination, requested by authority: Secondary | ICD-10-CM | POA: Diagnosis not present

## 2014-08-23 DIAGNOSIS — F250 Schizoaffective disorder, bipolar type: Secondary | ICD-10-CM

## 2014-08-23 DIAGNOSIS — F6089 Other specific personality disorders: Secondary | ICD-10-CM

## 2014-08-23 LAB — CBG MONITORING, ED
Glucose-Capillary: 141 mg/dL — ABNORMAL HIGH (ref 70–99)
Glucose-Capillary: 280 mg/dL — ABNORMAL HIGH (ref 70–99)
Glucose-Capillary: 150 mg/dL — ABNORMAL HIGH (ref 70–99)

## 2014-08-23 NOTE — ED Notes (Signed)
Patient refused vital signs and CBG.

## 2014-08-23 NOTE — Consult Note (Signed)
[facility] Face-to-Face Psychiatry Consult   Reason for Consult:  Schizoaffective disorder, aggression Referring Physician:  EDP  [name] is an 53 y.o. male. Total Time spent with patient: 30 minutes  Assessment: AXIS I:  Schizoaffective disorder; bipolar type; aggression AXIS II:  Deferred AXIS III:   Past Medical History  Diagnosis Date  . Bipolar 1 disorder   . Schizophrenia   . Hypertension    AXIS IV:  other psychosocial or environmental problems, problems related to social environment, problems with primary support group and chronic mental illness; noncompliance AXIS V:  21-30 behavior considerably influenced by delusions or hallucinations OR serious impairment in judgment, communication OR inability to function in almost all areas  Review of Systems  Constitutional: Negative.   HENT: Negative.   Eyes: Negative.   Respiratory: Negative.   Cardiovascular: Negative.   Gastrointestinal: Negative.   Genitourinary: Negative.   Musculoskeletal: Negative.   Skin: Negative.   Neurological: Negative.   Endo/Heme/Allergies: Negative.   Psychiatric/Behavioral: Positive for hallucinations.   Plan:  Recommend psychiatric Inpatient admission when medically cleared.  Subjective:   [name] is a 53 y.o. male patient admitted with aggression, unstable.  HPI:  Patient remains disorganized and irritable on assessment.  He demands to go to [place], [place] sine "I been through many counties."  He wants a new place, prefers his own house but due "to my record, I can't".  Then, he starts cursing.  He remains non-compliant with his medications and a risk for violence with threats and assaults prior to admission.  Past Psychiatric History: Past Medical History  Diagnosis Date  . Bipolar 1 disorder   . Schizophrenia   . Hypertension     reports that he has never smoked. He does not have any smokeless tobacco history on file. He reports that he does not drink alcohol or use  illicit drugs. History reviewed. No pertinent family history. 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) Can pt return to current living arrangement?:  (unk)   Allergies:  No Known Allergies  ACT Assessment Complete:  Yes:    Educational Status    Risk to Self: Risk to self with the past 6 months Suicidal Ideation: No Suicidal Intent: No Is patient at risk for suicide?: No Suicidal Plan?: No Access to Means: No What has been your use of drugs/alcohol within the last 12 months?:  (n/a) Previous Attempts/Gestures: No How many times?:  (n/a) Other Self Harm Risks:  (n/a) Triggers for Past Attempts: None known Intentional Self Injurious Behavior: None Family Suicide History: Unknown Recent stressful life event(s): Other (Comment) (patient denies stressors) Persecutory voices/beliefs?: No Depression: No (pt denies ) Depression Symptoms: Feeling angry/irritable (patient appears angry and irritable although he denies ) Substance abuse history and/or treatment for substance abuse?: No Suicide prevention information given to non-admitted patients: Not applicable  Risk to Others: Risk to Others within the past 6 months Homicidal Ideation: No Thoughts of Harm to Others: No Current Homicidal Intent: No Current Homicidal Plan: No Access to Homicidal Means: No Identified Victim:  (n/a) History of harm to others?: No Assessment of Violence: None Noted Violent Behavior Description:  (patient calm and cooperative ) Does patient have access to weapons?: No Criminal Charges Pending?: No Does patient have a court date: No  Abuse:    Prior Inpatient Therapy: Prior Inpatient Therapy Prior Inpatient Therapy: Yes Prior Therapy Dates: 2006,2011 Prior Therapy Facilty/Provider(s): [facility]  Reason for Treatment: Schizophrenia  Prior Outpatient Therapy: Prior Outpatient Therapy Prior Outpatient Therapy: No Prior Therapy Dates:  None  Prior Therapy Facilty/Provider(s): None  Reason for Treatment: None   Additional Information: Additional Information 1:1 In Past 12 Months?: No CIRT Risk: No Elopement Risk: No Does patient have medical clearance?: Yes                  Objective: Blood pressure 143/94, pulse 106, temperature 99.1 F (37.3 C), temperature source Oral, resp. rate 22, SpO2 96 %.There is no height or weight on file to calculate BMI. Results for orders placed or performed during the hospital encounter of 08/19/14 (from the past 72 hour(s))  CBG monitoring, ED     Status: Abnormal   Collection Time: 08/20/14 12:12 PM  Result Value Ref Range   Glucose-Capillary 102 (H) 70 - 99 mg/dL  CBG monitoring, ED     Status: Abnormal   Collection Time: 08/20/14  5:24 PM  Result Value Ref Range   Glucose-Capillary 193 (H) 70 - 99 mg/dL  CBG monitoring, ED     Status: Abnormal   Collection Time: 08/21/14  8:07 AM  Result Value Ref Range   Glucose-Capillary 117 (H) 70 - 99 mg/dL  CBG monitoring, ED     Status: Abnormal   Collection Time: 08/21/14  6:27 PM  Result Value Ref Range   Glucose-Capillary 129 (H) 70 - 99 mg/dL  CBG monitoring, ED     Status: Abnormal   Collection Time: 08/22/14  6:59 AM  Result Value Ref Range   Glucose-Capillary 140 (H) 70 - 99 mg/dL  CBG monitoring, ED     Status: Abnormal   Collection Time: 08/22/14 12:53 PM  Result Value Ref Range   Glucose-Capillary 133 (H) 70 - 99 mg/dL  CBG monitoring, ED     Status: Abnormal   Collection Time: 08/22/14  5:49 PM  Result Value Ref Range   Glucose-Capillary 174 (H) 70 - 99 mg/dL  CBG monitoring, ED     Status: Abnormal   Collection Time: 08/23/14  8:20 AM  Result Value Ref Range   Glucose-Capillary 141 (H) 70 - 99 mg/dL   Labs are reviewed and are pertinent for no medical issues warranting treatment.  Current Facility-Administered Medications  Medication Dose Route Frequency Provider Last Rate Last Dose  .  acetaminophen (TYLENOL) tablet 650 mg  650 mg Oral Q4H PRN Kristen N Ward, DO      . alum & mag hydroxide-simeth (MAALOX/MYLANTA) 200-200-20 MG/5ML suspension 30 mL  30 mL Oral PRN Kristen N Ward, DO      . amLODipine (NORVASC) tablet 10 mg  10 mg Oral Daily Kristen N Ward, DO   10 mg at 08/21/14 1610  . atorvastatin (LIPITOR) tablet 10 mg  10 mg Oral Daily Kristen N Ward, DO   10 mg at 08/19/14 1733  . benztropine (COGENTIN) tablet 1 mg  1 mg Oral Daily Mojeed Akintayo   1 mg at 08/20/14 1157  . carbamazepine (TEGRETOL) tablet 200 mg  200 mg Oral BID PC Kristen N Ward, DO   200 mg at 08/19/14 1732  . cloNIDine (CATAPRES) tablet 0.2 mg  0.2 mg Oral BID Kristen N Ward, DO   0.2 mg at 08/22/14 9604  . clopidogrel (PLAVIX) tablet 75 mg  75 mg Oral Daily Kristen N Ward, DO   75 mg at 08/20/14 1053  . hydrALAZINE (APRESOLINE) tablet 25 mg  25 mg Oral BID Kristen N Ward, DO   25 mg at  08/23/14 [id number]  . ibuprofen (ADVIL,MOTRIN) tablet 600 mg  600 mg Oral Q8H PRN Kristen N Ward, DO      . insulin aspart (novoLOG) injection 0-5 Units  0-5 Units Subcutaneous QHS Kristen N Ward, DO   0 Units at 08/20/14 0309  . insulin aspart (novoLOG) injection 0-9 Units  0-9 Units Subcutaneous TID WC Kristen N Ward, DO   2 Units at 08/20/14 1828  . LORazepam (ATIVAN) tablet 1 mg  1 mg Oral Q8H PRN Kristen N Ward, DO      . LORazepam (ATIVAN) tablet 1 mg  1 mg Oral Q6H PRN Kristen N Ward, DO      . metoprolol tartrate (LOPRESSOR) tablet 75 mg  75 mg Oral BID Kristen N Ward, DO   75 mg at 08/23/14 [id number]  . nicotine (NICODERM CQ - dosed in mg/24 hours) patch 21 mg  21 mg Transdermal Daily Kristen N Ward, DO   21 mg at 08/19/14 1855  . ondansetron (ZOFRAN) tablet 4 mg  4 mg Oral Q8H PRN Kristen N Ward, DO      . paliperidone (INVEGA) 24 hr tablet 9 mg  9 mg Oral Daily Mojeed Akintayo   0 mg at 08/20/14 1740  . potassium chloride (K-DUR,KLOR-CON) CR tablet 10 mEq  10 mEq Oral Daily Kristen N Ward, DO   10 mEq at 08/19/14 1734  .  traZODone (DESYREL) tablet 150 mg  150 mg Oral QHS Mojeed Akintayo   150 mg at 08/21/14 [id number]   Current Outpatient Prescriptions  Medication Sig Dispense Refill  . amLODipine (NORVASC) 10 MG tablet Take 1 tablet (10 mg total) by mouth daily.    Marland Kitchen. atorvastatin (LIPITOR) 10 MG tablet Take 1 tablet (10 mg total) by mouth at bedtime.    . carbamazepine (TEGRETOL) 200 MG tablet Take 1 tablet (200 mg total) by mouth 2 (two) times daily after a meal. 60 tablet 0  . clopidogrel (PLAVIX) 75 MG tablet Take 1 tablet (75 mg total) by mouth every morning.    . hydrALAZINE (APRESOLINE) 25 MG tablet Take 1 tablet (25 mg total) by mouth 3 (three) times daily. Does not take systolic is >100 (Patient taking differently: Take 25 mg by mouth 2 (two) times daily. Does not take systolic is >100)    . LORazepam (ATIVAN) 1 MG tablet Take 1 mg by mouth every 6 (six) hours as needed for anxiety (anxiety).    . metoprolol (LOPRESSOR) 50 MG tablet Take 75 mg by mouth 2 (two) times daily.    . potassium chloride (K-DUR,KLOR-CON) 10 MEQ tablet Take 1 tablet (10 mEq total) by mouth daily.    . QUEtiapine (SEROQUEL) 200 MG tablet Take 1 tablet (200 mg total) by mouth at bedtime. (Patient taking differently: Take 200 mg by mouth 2 (two) times daily. ) 30 tablet 0  . risperiDONE microspheres (RISPERDAL CONSTA) 50 MG injection Inject 2 mLs (50 mg total) into the muscle every 14 (fourteen) days. 1 each   . traZODone (DESYREL) 50 MG tablet Take 1 tablet (50 mg total) by mouth at bedtime. 30 tablet 0  . insulin aspart (NOVOLOG FLEXPEN) 100 UNIT/ML FlexPen Inject 2-10 Units into the skin 3 (three) times daily with meals. According to Sliding Scale  If blood sugar level is: 151-200= 2 units; 201-250= 4 units; 251-300= 6 units; 301-350= 8 units and  If 351-400= 10 units      Psychiatric Specialty Exam:     Blood pressure 143/94, pulse 106, temperature 99.1  F (37.3 C), temperature source Oral, resp. rate 22, SpO2 96 %.There is no  height or weight on file to calculate BMI.  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

## 2014-08-23 NOTE — ED Notes (Signed)
Patient seen pacing the hall way; responding to internal stimuli with some hand gestures. Pleasant on approach. Patient presented with some confabulation of speech and defensive when asked questions by this writer and occassionally, making no sense. 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 Will continue to monitor patient.

## 2014-08-23 NOTE — ED Notes (Signed)
Multiple requests to give ordered insulin.  Patient is resistant and becomes irritable.  States he takes glucophage at home.

## 2014-08-23 NOTE — ED Notes (Signed)
Patient refused Insulin Novolog (CBG was 150 mg/dl) and all his bed time medications. Refused education stated "please save me that your Phd talk" became agitated and asked this writer to leave his presence.

## 2014-08-24 ENCOUNTER — Encounter (HOSPITAL_COMMUNITY): Payer: Self-pay | Admitting: Psychiatry

## 2014-08-24 DIAGNOSIS — E119 Type 2 diabetes mellitus without complications: Secondary | ICD-10-CM

## 2014-08-24 DIAGNOSIS — N183 Chronic kidney disease, stage 3 (moderate): Secondary | ICD-10-CM

## 2014-08-24 DIAGNOSIS — E1165 Type 2 diabetes mellitus with hyperglycemia: Secondary | ICD-10-CM

## 2014-08-24 DIAGNOSIS — N189 Chronic kidney disease, unspecified: Secondary | ICD-10-CM

## 2014-08-24 DIAGNOSIS — F250 Schizoaffective disorder, bipolar type: Secondary | ICD-10-CM | POA: Diagnosis not present

## 2014-08-24 DIAGNOSIS — Z046 Encounter for general psychiatric examination, requested by authority: Secondary | ICD-10-CM | POA: Diagnosis not present

## 2014-08-24 LAB — CBG MONITORING, ED
GLUCOSE-CAPILLARY: 161 mg/dL — AB (ref 70–99)
Glucose-Capillary: 122 mg/dL — ABNORMAL HIGH (ref 70–99)
Glucose-Capillary: 156 mg/dL — ABNORMAL HIGH (ref 70–99)

## 2014-08-24 NOTE — ED Notes (Signed)
Up to the bathroom 

## 2014-08-24 NOTE — Consult Note (Signed)
[facility] Medical Consultation  [name] ZOX:[id number] DOB: 11/22/1960 DOA: 08/19/2014 PCP: Burtis JunesBLOUNT,ALVIN VINCENT, MD   Requesting physician: ED personnel Date of consultation: 08/24/14 Reason for consultation: CKD and management of DM  Impression/Recommendations Active Problems:   Schizoaffective disorder, bipolar type - Management per psychiatry    DM (diabetes mellitus) - Patient is currently on regular diet and per reports he is refusing his diabetic medication - As such will place on diabetic diet in hopes that with diet control blood sugars will be improved. Last CBG reported a blood glucose level of 122.    Chronic kidney disease - Based on lab report we have patient is at or near baseline. - His last serum creatinine level reportedly was at 2.5 and BUN at 16 as such I doubt he is having prerenal etiology. - May consider encouraging oral intake of fluids particularly water and reassessing serum creatinine next a.m. - Would avoid NSAID S and would treat pain with acetaminophen if no contraindications to that medication  - Would also assess for renal obstruction and obtain renal ultrasound if patient allows  Chief Complaint: Aggression associated with schizoaffective disorder  HPI:  Patient is a 53 year old with history of aggression associated with schizoaffective disorder currently being evaluated and treated in the emergency department. He is currently in his diabetic medication. Much of the history is obtained from EMR as patient did not want to talk to me and dismiss me as he "is trying to get away from doctors." We were consulted for further recommendations regarding patient's diabetes and kidney function.  Review of Systems:  Unable to assess secondary to limited cooperation  Past Medical History  Diagnosis Date  . Bipolar 1 disorder   . Schizophrenia   . Hypertension    History reviewed. No pertinent past surgical history. Social History:  reports  that he has never smoked. He does not have any smokeless tobacco history on file. He reports that he does not drink alcohol or use illicit drugs.  No Known Allergies History reviewed. No pertinent family history.  Prior to Admission medications   Medication Sig Start Date End Date Taking? Authorizing Provider  amLODipine (NORVASC) 10 MG tablet Take 1 tablet (10 mg total) by mouth daily. 07/30/14  Yes [name], NP  atorvastatin (LIPITOR) 10 MG tablet Take 1 tablet (10 mg total) by mouth at bedtime. 07/30/14  Yes [name], NP  carbamazepine (TEGRETOL) 200 MG tablet Take 1 tablet (200 mg total) by mouth 2 (two) times daily after a meal. 07/30/14  Yes [name], NP  clopidogrel (PLAVIX) 75 MG tablet Take 1 tablet (75 mg total) by mouth every morning. 07/30/14  Yes [name], NP  hydrALAZINE (APRESOLINE) 25 MG tablet Take 1 tablet (25 mg total) by mouth 3 (three) times daily. Does not take systolic is >100 Patient taking differently: Take 25 mg by mouth 2 (two) times daily. Does not take systolic is >100 81/08/9110/2/15  Yes [name], NP  LORazepam (ATIVAN) 1 MG tablet Take 1 mg by mouth every 6 (six) hours as needed for anxiety (anxiety).   Yes Historical Provider, MD  metoprolol (LOPRESSOR) 50 MG tablet Take 75 mg by mouth 2 (two) times daily.   Yes Historical Provider, MD  potassium chloride (K-DUR,KLOR-CON) 10 MEQ tablet Take 1 tablet (10 mEq total) by mouth daily. 07/30/14  Yes [name], NP  QUEtiapine (SEROQUEL) 200 MG tablet Take 1 tablet (200 mg total) by mouth at bedtime. Patient taking differently: Take 200 mg by  mouth 2 (two) times daily.  07/30/14  Yes [name], NP  risperiDONE microspheres (RISPERDAL CONSTA) 50 MG injection Inject 2 mLs (50 mg total) into the muscle every 14 (fourteen) days. 07/30/14  Yes [name], NP  traZODone (DESYREL) 50 MG tablet Take 1 tablet (50 mg total) by mouth at bedtime. 07/30/14  Yes [name], NP  insulin aspart (NOVOLOG  FLEXPEN) 100 UNIT/ML FlexPen Inject 2-10 Units into the skin 3 (three) times daily with meals. According to Sliding Scale  If blood sugar level is: 151-200= 2 units; 201-250= 4 units; 251-300= 6 units; 301-350= 8 units and  If 351-400= 10 units    Historical Provider, MD   Physical Exam: Blood pressure 131/80, pulse 81, temperature 98.3 F (36.8 C), temperature source Oral, resp. rate 18, SpO2 98 %. Filed Vitals:   08/24/14 0954  BP: 131/80  Pulse: 81  Temp:   Resp:     Vital signs reviewed patient refuses physical exam  Gen. patient awake and alert  Labs on Admission:  Basic Metabolic Panel:  Recent Labs Lab 08/19/14 1422  NA 140  K 3.7  CL 109  CO2 19  GLUCOSE 230*  BUN 16  CREATININE 2.59*  CALCIUM 8.5   Liver Function Tests:  Recent Labs Lab 08/19/14 1422  AST 52*  ALT 51  ALKPHOS 60  BILITOT 0.7  PROT 6.7  ALBUMIN 3.2*   No results for input(s): LIPASE, AMYLASE in the last 168 hours. No results for input(s): AMMONIA in the last 168 hours. CBC:  Recent Labs Lab 08/19/14 1422  WBC 4.9  HGB 10.5*  HCT 32.0*  MCV 80.8  PLT 264   Cardiac Enzymes: No results for input(s): CKTOTAL, CKMB, CKMBINDEX, TROPONINI in the last 168 hours. BNP: Invalid input(s): POCBNP CBG:  Recent Labs Lab 08/22/14 1749 08/23/14 0820 08/23/14 1738 08/23/14 2109 08/24/14 0828  GLUCAP 174* 141* 280* 150* 122*    Radiological Exams on Admission: No results found.   Time spent: > 35 minutes  Penny PiaVEGA, Kylan Veach [facility] Pager [phone]  If 7PM-7AM, please contact night-coverage [email or web address] Password [email or web address] 08/24/2014, 3:28 PM

## 2014-08-24 NOTE — ED Notes (Signed)
[facility]

## 2014-08-24 NOTE — Progress Notes (Signed)
CSW called [facility] to follow up.  [facility]    Adelene AmasEdith Alla Sloma, LCSW Disposition Social Worker [phone]

## 2014-08-24 NOTE — ED Notes (Signed)
Pt declining to have renal US--cancel VORB Shelly

## 2014-08-24 NOTE — ED Notes (Signed)
On the phone in the day room--Hospitalist into see

## 2014-08-24 NOTE — ED Notes (Signed)
Shelly NP talking w/ pt

## 2014-08-24 NOTE — ED Notes (Signed)
Pt refused vitals signs.
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
## 2014-08-24 NOTE — ED Notes (Signed)
Dr Cobos and Shelly NP into see 

## 2014-08-24 NOTE — Progress Notes (Addendum)
2pm. CSW spoke with pt's guardian, Shepard Generalnnette Hines to provide update, per pt's request. Guardian states that she believes pt will be most successful if he has a longer-term hospitalization, because this will allow him to get onto a medication regimen. Otherwise, he will leave a hospital/ED and resume not taking his medicine. She states that the last time he went to [place], in [place], he was stable for over a year.   CSW asked guardian about pt's kidney health, per request of nursing staff due to his labs. Guardian [place] they placed a stint in his heart. They also mentioned that he had problems with his liver and kidneys. However, pt did not allow the medical providers to treat these conditions.  Mariann LasterAlexandra Kadey Mihalic LCSWA,     ED CSW  phone: [phone]

## 2014-08-24 NOTE — ED Notes (Signed)
Up   To the bathroom 

## 2014-08-24 NOTE — ED Notes (Signed)
Up in hall requesting to talk to somebody about leaving, will relay request.

## 2014-08-24 NOTE — ED Notes (Signed)
Patient presents irritable and uncooperative. Restless walking up and down the halls. Not answering assessment questions. NAD

## 2014-08-24 NOTE — ED Notes (Signed)
Shelly NP into talk to pt

## 2014-08-24 NOTE — ED Notes (Signed)
On the phone in the day room

## 2014-08-25 DIAGNOSIS — R451 Restlessness and agitation: Secondary | ICD-10-CM

## 2014-08-25 DIAGNOSIS — Z046 Encounter for general psychiatric examination, requested by authority: Secondary | ICD-10-CM | POA: Diagnosis not present

## 2014-08-25 DIAGNOSIS — F319 Bipolar disorder, unspecified: Secondary | ICD-10-CM

## 2014-08-25 DIAGNOSIS — F259 Schizoaffective disorder, unspecified: Secondary | ICD-10-CM

## 2014-08-25 LAB — CBG MONITORING, ED
GLUCOSE-CAPILLARY: 126 mg/dL — AB (ref 70–99)
GLUCOSE-CAPILLARY: 180 mg/dL — AB (ref 70–99)

## 2014-08-25 MED ORDER — HALOPERIDOL LACTATE 5 MG/ML IJ SOLN
5.0000 mg | Freq: Two times a day (BID) | INTRAMUSCULAR | Status: DC
  Administered 2014-08-25: 5 mg via INTRAMUSCULAR
  Filled 2014-08-25: qty 1

## 2014-08-25 MED ORDER — HALOPERIDOL LACTATE 5 MG/ML IJ SOLN
5.0000 mg | Freq: Two times a day (BID) | INTRAMUSCULAR | Status: DC
  Administered 2014-08-25: 5 mg via INTRAMUSCULAR
  Filled 2014-08-25 (×2): qty 1

## 2014-08-25 MED ORDER — LORAZEPAM 1 MG PO TABS: 1.0000 mg | ORAL_TABLET | Freq: Two times a day (BID) | ORAL | Status: DC

## 2014-08-25 MED ORDER — LORAZEPAM 2 MG/ML IJ SOLN
1.0000 mg | Freq: Two times a day (BID) | INTRAMUSCULAR | Status: DC
Start: 2014-08-25 — End: 2014-08-27
  Administered 2014-08-25 (×2): 1 mg via INTRAMUSCULAR
  Filled 2014-08-25 (×2): qty 1

## 2014-08-25 NOTE — ED Notes (Signed)
Renal ultrasound cancelled per request of ultrasound tech and according to previous RN notes.

## 2014-08-25 NOTE — Progress Notes (Signed)
[facility]   Byrd HesselbachKristen Marion Rosenberry, LCSW [phone]  ED CSW 08/25/2014 0905am

## 2014-08-25 NOTE — ED Notes (Signed)
Pt ambulatory about the floor, AAO x 3, gait steady, no distress at present. Will continue to monitor for safety.
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
## 2014-08-26 DIAGNOSIS — F319 Bipolar disorder, unspecified: Secondary | ICD-10-CM | POA: Diagnosis not present

## 2014-08-26 DIAGNOSIS — R451 Restlessness and agitation: Secondary | ICD-10-CM | POA: Diagnosis not present

## 2014-08-26 DIAGNOSIS — Z046 Encounter for general psychiatric examination, requested by authority: Secondary | ICD-10-CM | POA: Diagnosis not present

## 2014-08-26 DIAGNOSIS — F259 Schizoaffective disorder, unspecified: Secondary | ICD-10-CM | POA: Diagnosis not present

## 2014-08-26 LAB — CBG MONITORING, ED
GLUCOSE-CAPILLARY: 152 mg/dL — AB (ref 70–99)
GLUCOSE-CAPILLARY: 131 mg/dL — AB (ref 70–99)
Glucose-Capillary: 107 mg/dL — ABNORMAL HIGH (ref 70–99)
Glucose-Capillary: 166 mg/dL — ABNORMAL HIGH (ref 70–99)

## 2014-08-26 MED ORDER — DIPHENHYDRAMINE HCL 50 MG/ML IJ SOLN
50.0000 mg | Freq: Once | INTRAMUSCULAR | Status: AC
  Administered 2014-08-26: 50 mg via INTRAMUSCULAR
  Filled 2014-08-26: qty 1

## 2014-08-26 MED ORDER — HALOPERIDOL DECANOATE 100 MG/ML IM SOLN
100.0000 mg | Freq: Once | INTRAMUSCULAR | Status: AC
  Administered 2014-08-26: 100 mg via INTRAMUSCULAR
  Filled 2014-08-26: qty 1

## 2014-08-26 NOTE — ED Notes (Signed)
Refuses medications for increased BP.  States "retake it when I'm settled down."

## 2014-08-26 NOTE — Progress Notes (Signed)
Pt remains on [facility] waitlist per Junious Dresseronnie.   Byrd HesselbachKristen Jensyn Cambria, LCSW [phone]  ED CSW 08/26/2014 [phone]

## 2014-08-26 NOTE — Progress Notes (Addendum)
CSW spoke with [name], [phone], patient guardian, who states that [facility] per guardian. Per guardian, pt does not wish to return to group home and guardian does not want to force [facility] to [facility]. CSW and guardian discussed that guardian is responsible for finding a new group home and encouraged guardian to begin looking for a new placement as patient continues to stabilize.CSW offered to assist with placement for patient, however once patient is medically and psychiatrically stable pt guardian will be responsible for patient placement. Guardian verbalized understanding.  CSW and guardian discussed pt current disposition of pending [facility] unless patient is able to stabilize in the ed before bed is available. Pt guardian, locating pt previous care cooridnators information, she believes with [facility], however will call csw back with contact information.   CSW spoke with [name], [facility], regarding pt readmission. Per [name], pt [facility] in patient lifetime. Per [name], [facility]. [facility] [place]. CSW awaiting return call.   [facility] to [facility] about [facility] returning when stable.Per [name] is unable to return due to patient being unwillingly to return.   [name] HesselbachKristen Masayo Fera, LCSW [phone]  ED CSW 08/26/2014 1046am   CSW received call back from pt guardian, who provided [facility] information [facility]. [phone] [name] PoreLisa [name] direct number [phone]. CSW called care coordinator and left message.   [name] HesselbachKristen Gracynn Rajewski, LCSW [phone]  ED CSW 08/26/2014 1046am

## 2014-08-26 NOTE — ED Notes (Signed)
Pt ambulatory about the floor, cooperative at present no distress or complaints noted, will continue to monitor for safety.
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
## 2014-08-27 DIAGNOSIS — Z046 Encounter for general psychiatric examination, requested by authority: Secondary | ICD-10-CM | POA: Diagnosis not present

## 2014-08-27 LAB — CBG MONITORING, ED
GLUCOSE-CAPILLARY: 121 mg/dL — AB (ref 70–99)
GLUCOSE-CAPILLARY: 134 mg/dL — AB (ref 70–99)

## 2014-08-27 MED ORDER — CARBAMAZEPINE 200 MG PO TABS: 200.0000 mg | ORAL_TABLET | Freq: Two times a day (BID) | ORAL | Status: AC

## 2014-08-27 MED ORDER — PALIPERIDONE ER 9 MG PO TB24: 9.0000 mg | ORAL_TABLET | Freq: Every day | ORAL | Status: AC

## 2014-08-27 MED ORDER — CLONIDINE HCL 0.2 MG PO TABS: 0.2000 mg | ORAL_TABLET | Freq: Two times a day (BID) | ORAL | Status: DC

## 2014-08-27 MED ORDER — ATORVASTATIN CALCIUM 10 MG PO TABS: 10.0000 mg | ORAL_TABLET | Freq: Every day | ORAL | Status: AC

## 2014-08-27 MED ORDER — BENZTROPINE MESYLATE 1 MG PO TABS: 1.0000 mg | ORAL_TABLET | Freq: Every day | ORAL | Status: AC

## 2014-08-27 MED ORDER — HALOPERIDOL DECANOATE 100 MG/ML IM SOLN
100.0000 mg | INTRAMUSCULAR | Status: DC
Start: 2014-08-27 — End: 2014-09-24

## 2014-08-27 MED ORDER — CLOPIDOGREL BISULFATE 75 MG PO TABS: 75.0000 mg | ORAL_TABLET | ORAL | Status: AC

## 2014-08-27 MED ORDER — AMLODIPINE BESYLATE 10 MG PO TABS: 10.0000 mg | ORAL_TABLET | Freq: Every day | ORAL | Status: DC

## 2014-08-27 NOTE — Consult Note (Signed)
[facility] Face-to-Face Psychiatry Consult   Reason for Consult:  Schizoaffective disorder, aggression Referring Physician:  EDP  [name] is an 53 y.o. male. Total Time spent with patient: 30 minutes  Assessment: AXIS I:  Schizoaffective disorder; bipolar type; aggression AXIS II:  Deferred AXIS III:   Past Medical History  Diagnosis Date  . Bipolar 1 disorder   . Schizophrenia   . Hypertension    AXIS IV:  other psychosocial or environmental problems, problems related to social environment, problems with primary support group and chronic mental illness; noncompliance AXIS V:  70; mild symptoms  Review of Systems  Constitutional: Negative.   HENT: Negative.   Eyes: Negative.   Respiratory: Negative.   Cardiovascular: Negative.   Gastrointestinal: Negative.   Genitourinary: Negative.   Musculoskeletal: Negative.   Skin: Negative.   Neurological: Negative.   Endo/Heme/Allergies: Negative.   Psychiatric/Behavioral: Positive for hallucinations.  Hallucinations have resolved, positive on admission not now  Plan:  Discharge back to group home, follow-up with regular providers, recommend long term antipsychotic injectable medication.  Subjective:   [name] is a 53 y.o. male patient is stable for discharge.  HPI:  Patient has stabilized after antipsychotic long term injection.  Denies homicidal/suicidal ideations, hallucinations, and alcohol/drug abuse.  He has been socially mingling appropriately on the unit with no aggression for the past few days.  Eating and sleeping regularly without issues.  Past Psychiatric History: Past Medical History  Diagnosis Date  . Bipolar 1 disorder   . Schizophrenia   . Hypertension     reports that he has never smoked. He does not have any smokeless tobacco history on file. He reports that he does not drink alcohol or use illicit drugs. History reviewed. No pertinent family history. Family History Substance Abuse: No Family  Supports: No Living Arrangements: Other (Comment) [facility], [facility]) Can pt return to current living arrangement?:  (unk)   Allergies:  No Known Allergies  ACT Assessment Complete:  Yes:    Educational Status    Risk to Self: Risk to self with the past 6 months Suicidal Ideation: No Suicidal Intent: No Is patient at risk for suicide?: No Suicidal Plan?: No Access to Means: No What has been your use of drugs/alcohol within the last 12 months?:  (n/a) Previous Attempts/Gestures: No How many times?:  (n/a) Other Self Harm Risks:  (n/a) Triggers for Past Attempts: None known Intentional Self Injurious Behavior: None Family Suicide History: Unknown Recent stressful life event(s): Other (Comment) (patient denies stressors) Persecutory voices/beliefs?: No Depression: No (pt denies ) Depression Symptoms: Feeling angry/irritable (patient appears angry and irritable although he denies ) Substance abuse history and/or treatment for substance abuse?: No Suicide prevention information given to non-admitted patients: Not applicable  Risk to Others: Risk to Others within the past 6 months Homicidal Ideation: No Thoughts of Harm to Others: No Current Homicidal Intent: No Current Homicidal Plan: No Access to Homicidal Means: No Identified Victim:  (n/a) History of harm to others?: No Assessment of Violence: None Noted Violent Behavior Description:  (patient calm and cooperative ) Does patient have access to weapons?: No Criminal Charges Pending?: No Does patient have a court date: No  Abuse:    Prior Inpatient Therapy: Prior Inpatient Therapy Prior Inpatient Therapy: Yes Prior Therapy Dates: 2006,2011 Prior Therapy Facilty/Provider(s): [facility]  Reason for Treatment: Schizophrenia   Prior Outpatient Therapy: Prior Outpatient Therapy Prior Outpatient Therapy: No Prior Therapy Dates: None  Prior Therapy Facilty/Provider(s): None  Reason for Treatment: None  Additional Information:  Additional Information 1:1 In Past 12 Months?: No CIRT Risk: No Elopement Risk: No Does patient have medical clearance?: Yes    Objective: Blood pressure 161/95, pulse 72, temperature 97.7 F (36.5 C), temperature source Oral, resp. rate 16, SpO2 97 %.There is no height or weight on file to calculate BMI. Results for orders placed or performed during the hospital encounter of 08/19/14 (from the past 72 hour(s))  CBG monitoring, ED     Status: Abnormal   Collection Time: 08/24/14  5:32 PM  Result Value Ref Range   Glucose-Capillary 161 (H) 70 - 99 mg/dL  CBG monitoring, ED     Status: Abnormal   Collection Time: 08/24/14  9:09 PM  Result Value Ref Range   Glucose-Capillary 156 (H) 70 - 99 mg/dL  CBG monitoring, ED     Status: Abnormal   Collection Time: 08/25/14  8:11 AM  Result Value Ref Range   Glucose-Capillary 126 (H) 70 - 99 mg/dL  CBG monitoring, ED     Status: Abnormal   Collection Time: 08/25/14  9:12 PM  Result Value Ref Range   Glucose-Capillary 180 (H) 70 - 99 mg/dL   Comment 1 Notify RN   CBG monitoring, ED     Status: Abnormal   Collection Time: 08/26/14  8:05 AM  Result Value Ref Range   Glucose-Capillary 107 (H) 70 - 99 mg/dL  CBG monitoring, ED     Status: Abnormal   Collection Time: 08/26/14 12:58 PM  Result Value Ref Range   Glucose-Capillary 152 (H) 70 - 99 mg/dL  CBG monitoring, ED     Status: Abnormal   Collection Time: 08/26/14  6:16 PM  Result Value Ref Range   Glucose-Capillary 131 (H) 70 - 99 mg/dL  CBG monitoring, ED     Status: Abnormal   Collection Time: 08/26/14  9:24 PM  Result Value Ref Range   Glucose-Capillary 166 (H) 70 - 99 mg/dL  CBG monitoring, ED     Status: Abnormal   Collection Time: 08/27/14  7:52 AM  Result Value Ref Range   Glucose-Capillary 121 (H) 70 - 99 mg/dL   Labs are reviewed and are pertinent for no medical issues warranting treatment.  Current Facility-Administered Medications  Medication Dose Route Frequency  Provider Last Rate Last Dose  . acetaminophen (TYLENOL) tablet 650 mg  650 mg Oral Q4H PRN Kristen N Ward, DO      . alum & mag hydroxide-simeth (MAALOX/MYLANTA) 200-200-20 MG/5ML suspension 30 mL  30 mL Oral PRN Kristen N Ward, DO      . amLODipine (NORVASC) tablet 10 mg  10 mg Oral Daily Kristen N Ward, DO   10 mg at 08/27/14 1610  . atorvastatin (LIPITOR) tablet 10 mg  10 mg Oral Daily Kristen N Ward, DO   10 mg at 08/19/14 1733  . benztropine (COGENTIN) tablet 1 mg  1 mg Oral Daily Jacquel Redditt   1 mg at 08/20/14 1157  . carbamazepine (TEGRETOL) tablet 200 mg  200 mg Oral BID PC Kristen N Ward, DO   200 mg at 08/19/14 1732  . cloNIDine (CATAPRES) tablet 0.2 mg  0.2 mg Oral BID Kristen N Ward, DO   0.2 mg at 08/25/14 1037  . clopidogrel (PLAVIX) tablet 75 mg  75 mg Oral Daily Kristen N Ward, DO   75 mg at 08/20/14 1053  . haloperidol lactate (HALDOL) injection 5 mg  5 mg Intramuscular BID Nanine Means, NP   Stopped at 08/26/14  2151  . hydrALAZINE (APRESOLINE) tablet 25 mg  25 mg Oral BID Kristen N Ward, DO   25 mg at 08/27/14 0841  . insulin aspart (novoLOG) injection 0-5 Units  0-5 Units Subcutaneous QHS Kristen N Ward, DO   0 Units at 08/20/14 0309  . insulin aspart (novoLOG) injection 0-9 Units  0-9 Units Subcutaneous TID WC Kristen N Ward, DO   2 Units at 08/20/14 1828  . LORazepam (ATIVAN) tablet 1 mg  1 mg Oral BID Earney NavyJosephine C Onuoha, NP   Stopped at 08/26/14 2151   Or  . LORazepam (ATIVAN) injection 1 mg  1 mg Intramuscular BID Earney NavyJosephine C Onuoha, NP   1 mg at 08/25/14 2128  . LORazepam (ATIVAN) tablet 1 mg  1 mg Oral Q8H PRN Kristen N Ward, DO      . metoprolol tartrate (LOPRESSOR) tablet 75 mg  75 mg Oral BID Kristen N Ward, DO   75 mg at 08/25/14 1037  . ondansetron (ZOFRAN) tablet 4 mg  4 mg Oral Q8H PRN Kristen N Ward, DO      . paliperidone (INVEGA) 24 hr tablet 9 mg  9 mg Oral Daily Luigi Stuckey   0 mg at 08/20/14 1740  . potassium chloride (K-DUR,KLOR-CON) CR tablet 10 mEq   10 mEq Oral Daily Kristen N Ward, DO   10 mEq at 08/19/14 1734  . traZODone (DESYREL) tablet 150 mg  150 mg Oral QHS Demitra Danley   150 mg at 08/21/14 [id number]   Current Outpatient Prescriptions  Medication Sig Dispense Refill  . amLODipine (NORVASC) 10 MG tablet Take 1 tablet (10 mg total) by mouth daily.    Marland Kitchen. atorvastatin (LIPITOR) 10 MG tablet Take 1 tablet (10 mg total) by mouth at bedtime.    . carbamazepine (TEGRETOL) 200 MG tablet Take 1 tablet (200 mg total) by mouth 2 (two) times daily after a meal. 60 tablet 0  . clopidogrel (PLAVIX) 75 MG tablet Take 1 tablet (75 mg total) by mouth every morning.    . hydrALAZINE (APRESOLINE) 25 MG tablet Take 1 tablet (25 mg total) by mouth 3 (three) times daily. Does not take systolic is >100 (Patient taking differently: Take 25 mg by mouth 2 (two) times daily. Does not take systolic is >100)    . LORazepam (ATIVAN) 1 MG tablet Take 1 mg by mouth every 6 (six) hours as needed for anxiety (anxiety).    . metoprolol (LOPRESSOR) 50 MG tablet Take 75 mg by mouth 2 (two) times daily.    . potassium chloride (K-DUR,KLOR-CON) 10 MEQ tablet Take 1 tablet (10 mEq total) by mouth daily.    . QUEtiapine (SEROQUEL) 200 MG tablet Take 1 tablet (200 mg total) by mouth at bedtime. (Patient taking differently: Take 200 mg by mouth 2 (two) times daily. ) 30 tablet 0  . risperiDONE microspheres (RISPERDAL CONSTA) 50 MG injection Inject 2 mLs (50 mg total) into the muscle every 14 (fourteen) days. 1 each   . traZODone (DESYREL) 50 MG tablet Take 1 tablet (50 mg total) by mouth at bedtime. 30 tablet 0  . insulin aspart (NOVOLOG FLEXPEN) 100 UNIT/ML FlexPen Inject 2-10 Units into the skin 3 (three) times daily with meals. According to Sliding Scale  If blood sugar level is: 151-200= 2 units; 201-250= 4 units; 251-300= 6 units; 301-350= 8 units and  If 351-400= 10 units      Psychiatric Specialty Exam:     Blood pressure 161/95, pulse 72, temperature 97.7 F (  36.5 C),  temperature source Oral, resp. rate 16, SpO2 97 %.There is no height or weight on file to calculate BMI.  General Appearance: Casual  Eye Contact::  Fair  Speech:  Garbled, Normal Rate and Slurred  Volume:  Normal  Mood:  Euthymic, irritable at times but this is his baseline  Affect:  Blunt  Thought Process:  Coherent, Goal Directed, Intact and Logical   Orientation:  Full (Time, Place, and Person)  Thought Content:  Clear and coherent  Suicidal Thoughts:  No  Homicidal Thoughts:  No  Memory:  Immediate;   Fair Recent;   Fair Remote;   Fair  Judgement:  Fair  Insight:  Lacking  Psychomotor Activity:  Normal   Concentration:  Fair  Recall:  FiservFair  Fund of Knowledge:Fair  Language: Fair  Akathisia:  No  Handed:  Right  AIMS (if indicated):     Assets:  Financial Resources/Insurance Housing Leisure Time Physical Health Resilience  Sleep:      Musculoskeletal: Strength & Muscle Tone: within normal limits Gait & Station: normal Patient leans: N/A  Treatment Plan Summary: Discharge to his group home with follow-up with his regular providers, recommend continuation of long term antipsychotic injectable.  Nanine MeansLORD, JAMISON, PMH-NP 08/27/2014 12:24 PM  Patient seen, evaluated and I agree with notes by Nurse Practitioner. Thedore MinsMojeed Hudsyn Barich, MD

## 2014-08-27 NOTE — Progress Notes (Addendum)
Pt responding well to medications. Patient calm and coopeartive. Per psychiatrist, patient psychiatrically stable for discharge back to group home. [facility]. Pt care coordinator and [facility] working on [facility] level [facility]) and funding for patient [facility] CSW spoke with guardian, [name] [phone] who agreed with plan. Pt care coordinator also aware. [facility], who will return CSW call at 12pm.   [name] HesselbachKristen Brentyn Seehafer, LCSW [phone]  ED CSW 08/27/2014 1118am  [facility] to [facility] at approximately 2/230pm   [name] HesselbachKristen Kieley Akter, LCSW [phone]  ED CSW 08/27/2014 1118am

## 2014-09-19 ENCOUNTER — Emergency Department (HOSPITAL_COMMUNITY): Payer: Medicare Other

## 2014-09-19 ENCOUNTER — Inpatient Hospital Stay (HOSPITAL_COMMUNITY)
Admission: EM | Admit: 2014-09-19 | Discharge: 2014-09-21 | DRG: 292 | Disposition: A | Discharge status: Skilled Nursing Facility | Payer: Medicare Other | Attending: Internal Medicine | Admitting: Internal Medicine

## 2014-09-19 ENCOUNTER — Encounter (HOSPITAL_COMMUNITY): Payer: Self-pay | Admitting: Family Medicine

## 2014-09-19 DIAGNOSIS — F259 Schizoaffective disorder, unspecified: Secondary | ICD-10-CM | POA: Diagnosis present

## 2014-09-19 DIAGNOSIS — R7989 Other specified abnormal findings of blood chemistry: Secondary | ICD-10-CM

## 2014-09-19 DIAGNOSIS — R0603 Acute respiratory distress: Secondary | ICD-10-CM

## 2014-09-19 DIAGNOSIS — I429 Cardiomyopathy, unspecified: Secondary | ICD-10-CM | POA: Diagnosis present

## 2014-09-19 DIAGNOSIS — J810 Acute pulmonary edema: Secondary | ICD-10-CM | POA: Insufficient documentation

## 2014-09-19 DIAGNOSIS — R0602 Shortness of breath: Secondary | ICD-10-CM

## 2014-09-19 DIAGNOSIS — F17200 Nicotine dependence, unspecified, uncomplicated: Secondary | ICD-10-CM | POA: Diagnosis present

## 2014-09-19 DIAGNOSIS — F319 Bipolar disorder, unspecified: Secondary | ICD-10-CM | POA: Insufficient documentation

## 2014-09-19 DIAGNOSIS — I129 Hypertensive chronic kidney disease with stage 1 through stage 4 chronic kidney disease, or unspecified chronic kidney disease: Secondary | ICD-10-CM | POA: Diagnosis present

## 2014-09-19 DIAGNOSIS — D509 Iron deficiency anemia, unspecified: Secondary | ICD-10-CM | POA: Insufficient documentation

## 2014-09-19 DIAGNOSIS — I5033 Acute on chronic diastolic (congestive) heart failure: Secondary | ICD-10-CM | POA: Diagnosis not present

## 2014-09-19 DIAGNOSIS — Z955 Presence of coronary angioplasty implant and graft: Secondary | ICD-10-CM | POA: Diagnosis not present

## 2014-09-19 DIAGNOSIS — R0902 Hypoxemia: Secondary | ICD-10-CM | POA: Diagnosis present

## 2014-09-19 DIAGNOSIS — E1165 Type 2 diabetes mellitus with hyperglycemia: Secondary | ICD-10-CM | POA: Diagnosis present

## 2014-09-19 DIAGNOSIS — N189 Chronic kidney disease, unspecified: Secondary | ICD-10-CM | POA: Diagnosis present

## 2014-09-19 DIAGNOSIS — I2510 Atherosclerotic heart disease of native coronary artery without angina pectoris: Secondary | ICD-10-CM | POA: Diagnosis present

## 2014-09-19 DIAGNOSIS — I509 Heart failure, unspecified: Secondary | ICD-10-CM | POA: Insufficient documentation

## 2014-09-19 DIAGNOSIS — D649 Anemia, unspecified: Secondary | ICD-10-CM

## 2014-09-19 DIAGNOSIS — N183 Chronic kidney disease, stage 3 (moderate): Secondary | ICD-10-CM

## 2014-09-19 DIAGNOSIS — I5032 Chronic diastolic (congestive) heart failure: Secondary | ICD-10-CM | POA: Insufficient documentation

## 2014-09-19 DIAGNOSIS — E119 Type 2 diabetes mellitus without complications: Secondary | ICD-10-CM | POA: Insufficient documentation

## 2014-09-19 DIAGNOSIS — Z720 Tobacco use: Secondary | ICD-10-CM

## 2014-09-19 HISTORY — DX: Type 2 diabetes mellitus without complications: E11.9

## 2014-09-19 LAB — CBC WITH DIFFERENTIAL/PLATELET
BASOS ABS: 0 10*3/uL (ref 0.0–0.1)
BASOS PCT: 1 % (ref 0–1)
EOS ABS: 0.3 10*3/uL (ref 0.0–0.7)
Eosinophils Relative: 5 % (ref 0–5)
HCT: 28.2 % — ABNORMAL LOW (ref 39.0–52.0)
Hemoglobin: 9 g/dL — ABNORMAL LOW (ref 13.0–17.0)
Lymphocytes Relative: 15 % (ref 12–46)
Lymphs Abs: 1 10*3/uL (ref 0.7–4.0)
MCH: 24.5 pg — AB (ref 26.0–34.0)
MCHC: 31.9 g/dL (ref 30.0–36.0)
MCV: 76.6 fL — ABNORMAL LOW (ref 78.0–100.0)
Monocytes Absolute: 0.7 10*3/uL (ref 0.1–1.0)
Monocytes Relative: 11 % (ref 3–12)
Neutro Abs: 4.6 10*3/uL (ref 1.7–7.7)
Neutrophils Relative %: 68 % (ref 43–77)
PLATELETS: 304 10*3/uL (ref 150–400)
RBC: 3.68 MIL/uL — ABNORMAL LOW (ref 4.22–5.81)
RDW: 16 % — AB (ref 11.5–15.5)
WBC: 6.7 10*3/uL (ref 4.0–10.5)

## 2014-09-19 LAB — TSH: TSH: 0.767 u[IU]/mL (ref 0.350–4.500)

## 2014-09-19 LAB — GLUCOSE, CAPILLARY: Glucose-Capillary: 291 mg/dL — ABNORMAL HIGH (ref 70–99)

## 2014-09-19 LAB — IRON AND TIBC
Iron: 16 ug/dL — ABNORMAL LOW (ref 42–165)
Saturation Ratios: 8 % — ABNORMAL LOW (ref 20–55)
TIBC: 199 ug/dL — ABNORMAL LOW (ref 215–435)
UIBC: 183 ug/dL (ref 125–400)

## 2014-09-19 LAB — I-STAT TROPONIN, ED: TROPONIN I, POC: 0.02 ng/mL (ref 0.00–0.08)

## 2014-09-19 LAB — HEMOGLOBIN A1C
HEMOGLOBIN A1C: 6.6 % — AB (ref <5.7)
MEAN PLASMA GLUCOSE: 143 mg/dL — AB (ref <117)

## 2014-09-19 LAB — BASIC METABOLIC PANEL
Anion gap: 6 (ref 5–15)
BUN: 14 mg/dL (ref 6–23)
CALCIUM: 8 mg/dL — AB (ref 8.4–10.5)
CO2: 26 mmol/L (ref 19–32)
Chloride: 109 mmol/L (ref 96–112)
Creatinine, Ser: 2.16 mg/dL — ABNORMAL HIGH (ref 0.50–1.35)
GFR calc Af Amer: 39 mL/min — ABNORMAL LOW (ref >90)
GFR calc non Af Amer: 33 mL/min — ABNORMAL LOW (ref >90)
GLUCOSE: 215 mg/dL — AB (ref 70–99)
POTASSIUM: 4.1 mmol/L (ref 3.5–5.1)
Sodium: 141 mmol/L (ref 135–145)

## 2014-09-19 LAB — BRAIN NATRIURETIC PEPTIDE: B NATRIURETIC PEPTIDE 5: 506.3 pg/mL — AB (ref 0.0–100.0)

## 2014-09-19 LAB — TROPONIN I: Troponin I: 0.03 ng/mL (ref <0.031)

## 2014-09-19 MED ORDER — NICOTINE 14 MG/24HR TD PT24
14.0000 mg | MEDICATED_PATCH | Freq: Every day | TRANSDERMAL | Status: DC
  Filled 2014-09-19 (×3): qty 1

## 2014-09-19 MED ORDER — IPRATROPIUM-ALBUTEROL 0.5-2.5 (3) MG/3ML IN SOLN
3.0000 mL | Freq: Once | RESPIRATORY_TRACT | Status: AC
  Administered 2014-09-19: 3 mL via RESPIRATORY_TRACT
  Filled 2014-09-19: qty 3

## 2014-09-19 MED ORDER — TRAZODONE HCL 50 MG PO TABS
50.0000 mg | ORAL_TABLET | Freq: Every evening | ORAL | Status: DC | PRN
  Administered 2014-09-19: 50 mg via ORAL
  Filled 2014-09-19 (×2): qty 1

## 2014-09-19 MED ORDER — SODIUM CHLORIDE 0.9 % IJ SOLN: 3.0000 mL | INTRAMUSCULAR | Status: DC | PRN

## 2014-09-19 MED ORDER — LORAZEPAM 1 MG PO TABS: 1.0000 mg | ORAL_TABLET | Freq: Four times a day (QID) | ORAL | Status: DC | PRN

## 2014-09-19 MED ORDER — SODIUM CHLORIDE 0.9 % IJ SOLN
3.0000 mL | Freq: Two times a day (BID) | INTRAMUSCULAR | Status: DC
  Administered 2014-09-19 – 2014-09-21 (×4): 3 mL via INTRAVENOUS

## 2014-09-19 MED ORDER — HALOPERIDOL DECANOATE 100 MG/ML IM SOLN: 100.0000 mg | INTRAMUSCULAR | Status: DC

## 2014-09-19 MED ORDER — METOPROLOL TARTRATE 50 MG PO TABS
75.0000 mg | ORAL_TABLET | Freq: Two times a day (BID) | ORAL | Status: DC
  Administered 2014-09-19 – 2014-09-21 (×4): 75 mg via ORAL
  Filled 2014-09-19 (×5): qty 1

## 2014-09-19 MED ORDER — POTASSIUM CHLORIDE CRYS ER 10 MEQ PO TBCR
10.0000 meq | EXTENDED_RELEASE_TABLET | Freq: Every day | ORAL | Status: DC
  Administered 2014-09-20 – 2014-09-21 (×2): 10 meq via ORAL
  Filled 2014-09-19 (×2): qty 1

## 2014-09-19 MED ORDER — FUROSEMIDE 10 MG/ML IJ SOLN
40.0000 mg | Freq: Once | INTRAMUSCULAR | Status: AC
  Administered 2014-09-19: 40 mg via INTRAVENOUS
  Filled 2014-09-19: qty 4

## 2014-09-19 MED ORDER — PALIPERIDONE ER 6 MG PO TB24
9.0000 mg | ORAL_TABLET | Freq: Every day | ORAL | Status: DC
  Administered 2014-09-19: 9 mg via ORAL
  Filled 2014-09-19 (×3): qty 1

## 2014-09-19 MED ORDER — CLONIDINE HCL 0.2 MG PO TABS
0.2000 mg | ORAL_TABLET | Freq: Two times a day (BID) | ORAL | Status: DC
  Administered 2014-09-19 – 2014-09-21 (×4): 0.2 mg via ORAL
  Filled 2014-09-19 (×5): qty 1

## 2014-09-19 MED ORDER — SODIUM CHLORIDE 0.9 % IJ SOLN
3.0000 mL | Freq: Two times a day (BID) | INTRAMUSCULAR | Status: DC
  Administered 2014-09-19 – 2014-09-20 (×2): 3 mL via INTRAVENOUS

## 2014-09-19 MED ORDER — FUROSEMIDE 10 MG/ML IJ SOLN
60.0000 mg | Freq: Two times a day (BID) | INTRAMUSCULAR | Status: DC
  Administered 2014-09-19 – 2014-09-21 (×4): 60 mg via INTRAVENOUS
  Filled 2014-09-19 (×6): qty 6

## 2014-09-19 MED ORDER — SODIUM CHLORIDE 0.9 % IV SOLN: 250.0000 mL | INTRAVENOUS | Status: DC | PRN

## 2014-09-19 MED ORDER — HEPARIN SODIUM (PORCINE) 5000 UNIT/ML IJ SOLN
5000.0000 [IU] | Freq: Three times a day (TID) | INTRAMUSCULAR | Status: DC
  Administered 2014-09-20 (×2): 5000 [IU] via SUBCUTANEOUS
  Filled 2014-09-19 (×7): qty 1

## 2014-09-19 MED ORDER — ACETAMINOPHEN 325 MG PO TABS: 650.0000 mg | ORAL_TABLET | Freq: Four times a day (QID) | ORAL | Status: DC | PRN

## 2014-09-19 MED ORDER — ONDANSETRON HCL 4 MG PO TABS: 4.0000 mg | ORAL_TABLET | Freq: Four times a day (QID) | ORAL | Status: DC | PRN

## 2014-09-19 MED ORDER — BENZTROPINE MESYLATE 1 MG PO TABS
1.0000 mg | ORAL_TABLET | Freq: Every day | ORAL | Status: DC
  Administered 2014-09-20 – 2014-09-21 (×2): 1 mg via ORAL
  Filled 2014-09-19 (×2): qty 1

## 2014-09-19 MED ORDER — INSULIN ASPART 100 UNIT/ML ~~LOC~~ SOLN
0.0000 [IU] | Freq: Three times a day (TID) | SUBCUTANEOUS | Status: DC
Start: 2014-09-20 — End: 2014-09-21
  Administered 2014-09-20: 3 [IU] via SUBCUTANEOUS
  Administered 2014-09-20 – 2014-09-21 (×2): 2 [IU] via SUBCUTANEOUS
  Administered 2014-09-21: 3 [IU] via SUBCUTANEOUS

## 2014-09-19 MED ORDER — CARBAMAZEPINE 200 MG PO TABS
200.0000 mg | ORAL_TABLET | Freq: Two times a day (BID) | ORAL | Status: DC
  Administered 2014-09-20 – 2014-09-21 (×3): 200 mg via ORAL
  Filled 2014-09-19 (×5): qty 1

## 2014-09-19 MED ORDER — ONDANSETRON HCL 4 MG/2ML IJ SOLN: 4.0000 mg | Freq: Four times a day (QID) | INTRAMUSCULAR | Status: DC | PRN

## 2014-09-19 MED ORDER — ATORVASTATIN CALCIUM 10 MG PO TABS
10.0000 mg | ORAL_TABLET | Freq: Every day | ORAL | Status: DC
  Administered 2014-09-19 – 2014-09-20 (×2): 10 mg via ORAL
  Filled 2014-09-19 (×3): qty 1

## 2014-09-19 MED ORDER — INSULIN ASPART 100 UNIT/ML ~~LOC~~ SOLN
0.0000 [IU] | Freq: Every day | SUBCUTANEOUS | Status: DC
  Administered 2014-09-19: 3 [IU] via SUBCUTANEOUS

## 2014-09-19 MED ORDER — CLOPIDOGREL BISULFATE 75 MG PO TABS
75.0000 mg | ORAL_TABLET | ORAL | Status: DC
  Administered 2014-09-20 – 2014-09-21 (×2): 75 mg via ORAL
  Filled 2014-09-19 (×3): qty 1

## 2014-09-19 MED ORDER — ALBUTEROL SULFATE (2.5 MG/3ML) 0.083% IN NEBU: 2.5000 mg | INHALATION_SOLUTION | RESPIRATORY_TRACT | Status: DC | PRN

## 2014-09-19 MED ORDER — ACETAMINOPHEN 650 MG RE SUPP: 650.0000 mg | Freq: Four times a day (QID) | RECTAL | Status: DC | PRN

## 2014-09-19 NOTE — ED Notes (Signed)
Reg tray ordered 01/22 

## 2014-09-19 NOTE — ED Provider Notes (Signed)
CSN: [id number]     Arrival date & time 09/19/14  1526 History   First MD Initiated Contact with Patient 09/19/14 1527     Chief Complaint  Patient presents with  . Respiratory Distress     (Consider location/radiation/quality/duration/timing/severity/associated sxs/prior Treatment) HPI 54 year old male past medical history as below notable for hypertension, CAD status post stent last year, diabetes who presents to ED via EMS for shortness of breath. Patient reports having gradually worsening shortness of breath for the past 2 days with intermittent chest pressure. Chest pressure is reported as mild, intermittent, and sometimes pleuritic. Patient denies having any fevers, cough, nausea, vomiting, abdominal pain during this time. She does report having lower extremity edema. EMS reports on arrival he had sats in the mid 50s and he was given 3 sublingual nitroglycerin and set up on BiPAP. Patient remained stable in route with sats in the upper 90s. Patient received a full dose aspirin by EMS. No other complaints at this time.  Past Medical History  Diagnosis Date  . Bipolar 1 disorder   . Schizophrenia   . Hypertension    No past surgical history on file. No family history on file. History  Substance Use Topics  . Smoking status: Never Smoker   . Smokeless tobacco: Not on file  . Alcohol Use: No    Review of Systems  Constitutional: Negative for fever, activity change and appetite change.  HENT: Negative for congestion, rhinorrhea and sore throat.   Eyes: Negative for visual disturbance.  Respiratory: Positive for shortness of breath. Negative for cough.   Cardiovascular: Positive for leg swelling. Negative for chest pain and palpitations.  Gastrointestinal: Negative for nausea, vomiting, abdominal pain and diarrhea.  Genitourinary: Negative for dysuria, flank pain, decreased urine volume and difficulty urinating.  Musculoskeletal: Negative for back pain and neck pain.  Skin:  Negative for rash.  Neurological: Negative for dizziness, syncope, speech difficulty, weakness, light-headedness, numbness and headaches.  Psychiatric/Behavioral: Negative for confusion.      Allergies  Review of patient's allergies indicates no known allergies.  Home Medications   Prior to Admission medications   Medication Sig Start Date End Date Taking? Authorizing Provider  amLODipine (NORVASC) 10 MG tablet Take 1 tablet (10 mg total) by mouth daily. 08/27/14   [name], NP  atorvastatin (LIPITOR) 10 MG tablet Take 1 tablet (10 mg total) by mouth at bedtime. 08/27/14   [name], NP  benztropine (COGENTIN) 1 MG tablet Take 1 tablet (1 mg total) by mouth daily. 08/27/14   [name], NP  carbamazepine (TEGRETOL) 200 MG tablet Take 1 tablet (200 mg total) by mouth 2 (two) times daily after a meal. 08/27/14   [name], NP  cloNIDine (CATAPRES) 0.2 MG tablet Take 1 tablet (0.2 mg total) by mouth 2 (two) times daily. 08/27/14   [name], NP  clopidogrel (PLAVIX) 75 MG tablet Take 1 tablet (75 mg total) by mouth every morning. 08/27/14   [name], NP  haloperidol decanoate (HALDOL DECANOATE) 100 MG/ML injection Inject 1 mL (100 mg total) into the muscle every 28 (twenty-eight) days. 08/27/14   [name], NP  hydrALAZINE (APRESOLINE) 25 MG tablet Take 1 tablet (25 mg total) by mouth 3 (three) times daily. Does not take systolic is >100 Patient taking differently: Take 25 mg by mouth 2 (two) times daily. Does not take systolic is >100 40/04/8111/2/15   [name], NP  insulin aspart (NOVOLOG FLEXPEN) 100 UNIT/ML FlexPen Inject 2-10 Units into the skin 3 (  three) times daily with meals. According to Sliding Scale  If blood sugar level is: 151-200= 2 units; 201-250= 4 units; 251-300= 6 units; 301-350= 8 units and  If 351-400= 10 units    Historical Provider, MD  LORazepam (ATIVAN) 1 MG tablet Take 1 mg by mouth every 6 (six) hours as needed for anxiety (anxiety).    Historical  Provider, MD  metoprolol (LOPRESSOR) 50 MG tablet Take 75 mg by mouth 2 (two) times daily.    Historical Provider, MD  paliperidone (INVEGA) 9 MG 24 hr tablet Take 1 tablet (9 mg total) by mouth daily. 08/27/14   Nanine Means, NP  potassium chloride (K-DUR,KLOR-CON) 10 MEQ tablet Take 1 tablet (10 mEq total) by mouth daily. 07/30/14   Bonnetta Barry, NP  traZODone (DESYREL) 50 MG tablet Take 1 tablet (50 mg total) by mouth at bedtime. 07/30/14   Bonnetta Barry, NP   BP 140/90 mmHg  Pulse 89  Temp(Src) 97.9 F (36.6 C) (Oral)  Resp 20  SpO2 97% Physical Exam  Constitutional: He is oriented to person, place, and time. He appears well-developed and well-nourished. No distress.  HENT:  Head: Normocephalic and atraumatic.  Nose: Nose normal.  Mouth/Throat: Oropharynx is clear and moist. No oropharyngeal exudate.  Eyes: Conjunctivae and EOM are normal.  Neck: Normal range of motion. Neck supple. No JVD present.  Cardiovascular: Normal rate, regular rhythm, normal heart sounds and intact distal pulses.   Pulmonary/Chest: He is in respiratory distress. He has rales (BL bases).  Abdominal: Soft. He exhibits no distension. There is no tenderness. There is no rebound and no guarding.  Musculoskeletal: Normal range of motion.  2+ BL pedal edema as well as mild mild edema in BL hands.  Neurological: He is alert and oriented to person, place, and time. No cranial nerve deficit.  Skin: Skin is warm and dry. No rash noted.  Psychiatric: He has a normal mood and affect.    ED Course  Procedures (including critical care time) Labs Review Labs Reviewed  BASIC METABOLIC PANEL - Abnormal; Notable for the following:    Glucose, Bld 215 (*)    Creatinine, Ser 2.16 (*)    Calcium 8.0 (*)    GFR calc non Af Amer 33 (*)    GFR calc Af Amer 39 (*)    All other components within normal limits  CBC WITH DIFFERENTIAL - Abnormal; Notable for the following:    RBC 3.68 (*)    Hemoglobin 9.0 (*)    HCT 28.2  (*)    MCV 76.6 (*)    MCH 24.5 (*)    RDW 16.0 (*)    All other components within normal limits  BRAIN NATRIURETIC PEPTIDE - Abnormal; Notable for the following:    B Natriuretic Peptide 506.3 (*)    All other components within normal limits  I-STAT TROPOININ, ED    Imaging Review Dg Chest Portable 1 View  09/19/2014   ADDENDUM REPORT: 09/19/2014 16:33  ADDENDUM: These results were called by telephone at the time of interpretation on 09/19/2014 at 4:33 pm to Dr. Ames Dura , who verbally acknowledged these results.   Electronically Signed   By: Annia Belt M.D.   On: 09/19/2014 16:33   09/19/2014   CLINICAL DATA:  Patient with respiratory distress. History of hypertension and diabetes. Nonsmoker.  EXAM: PORTABLE CHEST - 1 VIEW  COMPARISON:  None.  FINDINGS: Cardiac and mediastinal contours upper limits of normal. Diffuse bilateral heterogeneous pulmonary opacities are demonstrated.  More focal consolidative opacity demonstrated within the left upper lung. Possible tiny bilateral pleural effusions. No pneumothorax. Monitoring leads overlie the patient.  IMPRESSION: Diffuse bilateral heterogeneous pulmonary opacities may represent pulmonary edema or potentially multi focal infection. More focal consolidative opacity within the left upper lung may be related to above described acute process however underlying mass is a consideration. Recommend short-term followup to ensure resolution. If this persists, chest CT would be warranted.  Possible tiny bilateral pleural effusions.  Electronically Signed: By: Annia Belt M.D. On: 09/19/2014 16:26     EKG Interpretation   Date/Time:  Friday September 19 2014 15:37:39 EST Ventricular Rate:  89 PR Interval:  226 QRS Duration: 101 QT Interval:  350 QTC Calculation: 426 R Axis:   78 Text Interpretation:  Sinus rhythm Prolonged PR interval Probable left  atrial enlargement Otherwise no significant change Confirmed by Romeo Apple   MD, FORREST ([id number]) on  09/19/2014 3:44:32 PM      MDM   Final diagnoses:  None    [name] is a 54 y.o. male with H&P as above. In arrival patient hemodynamic stable with sats in the upper 90s while BiPAP was ongoing. BiPAP was taken off the patient is speaking full sentences and has sats in the upper 90s with just nasal cannula. Current picture likely CHF exacerbation as patient has crackles bilaterally and diminished sounds in the bases with lower extremity edema and JVD. Patient already received nitroglycerin in route and patient reports significant improvement with that treatment as well as BiPAP. Screening labs have been sent, chest x-ray and EKG. EKG is unremarkable at this time. Screening labs before BNP of 500 and troponin 0.02. Temperature was made to wean patient from nasal cannula which failed this patient decided to 80%. He is now satting upper 90s on 3 L nasal cannula. Patient is having likely CHF exacerbation. The patient did have some mild diffuse expiratory wheezes on exam and a DuoNeb has been given. Chest x-ray findings above discussed with hospitalist and there is no indication for CT imaging while patient is in the ED. The patient will be admitted to a telemetry bed.  Clinical Impression: 1. CHF exacerbation   2. Respiratory distress   3. Hypocalcemia   4. Anemia, unspecified anemia type   5. Acute pulmonary edema   6. Elevated brain natriuretic peptide (BNP) level   7. Hypoxia     Pt seen in conjunction with Dr. Purvis Sheffield, MD  Ames Dura, DO [facility] Emergency Medicine Resident - PGY-2    Ames Dura, MD 09/19/14 Nida Boatman  Purvis Sheffield, MD 09/19/14 1958

## 2014-09-19 NOTE — ED Notes (Signed)
Pt resting at this time and O2 saturation at 86% room air, pt placed on 2L nasal cannula, O2 saturation now at 94%.

## 2014-09-19 NOTE — H&P (Signed)
[facility] History and Physical  [name] KittyMarvin Vowell YQM:[id number] DOB: 07/30/1961 DOA: 09/19/2014  Referring physician: er PCP: Burtis JunesBLOUNT,ALVIN VINCENT, MD   Chief Complaint: sob  HPI: [name] is a 54 y.o. male  Who was brought in from a group home.  He has h/o CAD with stents.  He has been having SOB for > 1 year but over the last few days he has been unable to lay flat and says his legs were swelling.  He also says he had some chest pain.  Described as intermittent.  No fevers, no chills, no productive cough.  When EMS arrived, his O2 sat was in the 50% on RA.  Patient continues to smoke.  He initially was placed on CPAP and was given IV Lasix and [facility]  He had 2 full urinals when I examined patient.    In the ER, BNP was elevated and x ray was suggestive of pulm edema.  No prior hx of CHF per patient   Review of Systems:  All systems reviewed, negative unless stated above   Past Medical History  Diagnosis Date  . Bipolar 1 disorder   . Schizophrenia   . Hypertension   . Diabetes mellitus without complication    History reviewed. No pertinent past surgical history. Social History:  reports that he has never smoked. He does not have any smokeless tobacco history on file. He reports that he does not drink alcohol or use illicit drugs. -lives in group home  No Known Allergies  No family history on file. - patient not able to give information- may have HTN in mother per patient  Prior to Admission medications   Medication Sig Start Date End Date Taking? Authorizing Provider  amLODipine (NORVASC) 10 MG tablet Take 1 tablet (10 mg total) by mouth daily. 08/27/14  Yes [name], NP  atorvastatin (LIPITOR) 10 MG tablet Take 1 tablet (10 mg total) by mouth at bedtime. 08/27/14  Yes [name], NP  benztropine (COGENTIN) 1 MG tablet Take 1 tablet (1 mg total) by mouth daily. 08/27/14  Yes [name], NP  carbamazepine (TEGRETOL) 200 MG tablet Take 1 tablet (200  mg total) by mouth 2 (two) times daily after a meal. 08/27/14  Yes [name], NP  cloNIDine (CATAPRES) 0.2 MG tablet Take 1 tablet (0.2 mg total) by mouth 2 (two) times daily. 08/27/14  Yes [name], NP  clopidogrel (PLAVIX) 75 MG tablet Take 1 tablet (75 mg total) by mouth every morning. 08/27/14  Yes [name], NP  haloperidol decanoate (HALDOL DECANOATE) 100 MG/ML injection Inject 1 mL (100 mg total) into the muscle every 28 (twenty-eight) days. 08/27/14  Yes [name], NP  hydrALAZINE (APRESOLINE) 25 MG tablet Take 1 tablet (25 mg total) by mouth 3 (three) times daily. Does not take systolic is >100 Patient taking differently: Take 25 mg by mouth 2 (two) times daily. Does not take systolic is >100 52/03/4111/2/15  Yes [name], NP  insulin aspart (NOVOLOG FLEXPEN) 100 UNIT/ML FlexPen Inject 2-10 Units into the skin 3 (three) times daily with meals. According to Sliding Scale  If blood sugar level is: 151-200= 2 units; 201-250= 4 units; 251-300= 6 units; 301-350= 8 units and  If 351-400= 10 units   Yes Historical Provider, MD  LORazepam (ATIVAN) 1 MG tablet Take 1 mg by mouth every 6 (six) hours as needed for anxiety (anxiety).   Yes Historical Provider, MD  metoprolol (LOPRESSOR) 50 MG tablet Take 75 mg by mouth  2 (two) times daily.   Yes Historical Provider, MD  paliperidone (INVEGA) 9 MG 24 hr tablet Take 1 tablet (9 mg total) by mouth daily. 08/27/14  Yes Nanine Means, NP  potassium chloride (K-DUR,KLOR-CON) 10 MEQ tablet Take 1 tablet (10 mEq total) by mouth daily. 07/30/14  Yes Bonnetta Barry, NP  traZODone (DESYREL) 50 MG tablet Take 1 tablet (50 mg total) by mouth at bedtime. Patient taking differently: Take 50 mg by mouth at bedtime as needed. Take as needed for sleep 07/30/14  Yes Bonnetta Barry, NP   Physical Exam: Filed Vitals:   09/19/14 1545 09/19/14 1645 09/19/14 1706 09/19/14 1715  BP: 151/84 146/76 151/92 154/82  Pulse: 80 79 91 83  Temp:      TempSrc:      Resp: SpO2: 97% 95% 96% 96%    Wt Readings from Last 3 Encounters:  No data found for Wt    General:  Appears calm and comfortable, on O2 [place] Eyes: PERRL, normal lids, irises & conjunctiva ENT: grossly normal hearing, lips & tongue Neck: no LAD, masses or thyromegaly Cardiovascular: RRR, no m/r/g. min LE edema. Telemetry: SR, no arrhythmias  Respiratory: diminished with crackles at bases but no wheezing Abdomen: soft, ntnd Skin: no rash or induration seen on limited exam Musculoskeletal: grossly normal tone BUE/BLE Psychiatric: grossly normal mood and affect, speech fluent and appropriate Neurologic: grossly non-focal.          Labs on Admission:  Basic Metabolic Panel:  Recent Labs Lab 09/19/14 1539  NA 141  K 4.1  CL 109  CO2 26  GLUCOSE 215*  BUN 14  CREATININE 2.16*  CALCIUM 8.0*   Liver Function Tests: No results for input(s): AST, ALT, ALKPHOS, BILITOT, PROT, ALBUMIN in the last 168 hours. No results for input(s): LIPASE, AMYLASE in the last 168 hours. No results for input(s): AMMONIA in the last 168 hours. CBC:  Recent Labs Lab 09/19/14 1539  WBC 6.7  NEUTROABS 4.6  HGB 9.0*  HCT 28.2*  MCV 76.6*  PLT 304   Cardiac Enzymes: No results for input(s): CKTOTAL, CKMB, CKMBINDEX, TROPONINI in the last 168 hours.  BNP (last 3 results) No results for input(s): PROBNP in the last 8760 hours. CBG: No results for input(s): GLUCAP in the last 168 hours.  Radiological Exams on Admission: Dg Chest Portable 1 View  09/19/2014   ADDENDUM REPORT: 09/19/2014 16:33  ADDENDUM: These results were called by telephone at the time of interpretation on 09/19/2014 at 4:33 pm to Dr. Ames Dura , who verbally acknowledged these results.   Electronically Signed   By: Annia Belt M.D.   On: 09/19/2014 16:33   09/19/2014   CLINICAL DATA:  Patient with respiratory distress. History of hypertension and diabetes. Nonsmoker.  EXAM: PORTABLE CHEST - 1 VIEW  COMPARISON:   None.  FINDINGS: Cardiac and mediastinal contours upper limits of normal. Diffuse bilateral heterogeneous pulmonary opacities are demonstrated. More focal consolidative opacity demonstrated within the left upper lung. Possible tiny bilateral pleural effusions. No pneumothorax. Monitoring leads overlie the patient.  IMPRESSION: Diffuse bilateral heterogeneous pulmonary opacities may represent pulmonary edema or potentially multi focal infection. More focal consolidative opacity within the left upper lung may be related to above described acute process however underlying mass is a consideration. Recommend short-term followup to ensure resolution. If this persists, chest CT would be warranted.  Possible tiny bilateral pleural effusions.  Electronically Signed: By: Annia Belt M.D. On: 09/19/2014  16:26    EKG: Independently reviewed. Sinus with LAE  Assessment/Plan Active Problems:   Schizoaffective disorder   DM (diabetes mellitus)   Chronic kidney disease   SOB (shortness of breath)   SOB most like due to fluid overload- IV lasix, echo, TSH, tele monitor, strict I/os, daily weights - x ray with pulm opacities- repeat x ray in AM after diuresis- may need CT scan -BNP elevate -cycle CE  DM with hyperglycemia- HgbA1C, SSI  Schizoaffective d/o- continue home meds  CKD- baseline around 2.5, monitor on lasix  Anemia- monitor, Fe panel  Code Status: full DVT Prophylaxis: Family Communication: patient Disposition Plan: tele admit  Time spent: 55 min  Marlin Canary [facility] Pager [phone]

## 2014-09-19 NOTE — ED Notes (Signed)
Pt presents from Group Home ([address] [place]) via [facility] wih c/o respiratory distress - pt reports SOB x2 days with today significant worse.  Pt also reports Chest pressure that began today.  EMS gave 3SL Nitro 324mg  ASA PTA.  Per EMS pt desats to 50% on RA, pt on CPAP on arrival.  Cheryle HorsfallEmily RT, Romeo AppleHarrison MD and ED Resident at bedside upon patient's arrival to room.  RT placed pt on Nonrebreather and sat was 100%, RT then placed patient on 3LNC at 98%.

## 2014-09-20 ENCOUNTER — Inpatient Hospital Stay (HOSPITAL_COMMUNITY): Payer: Medicare Other

## 2014-09-20 DIAGNOSIS — E119 Type 2 diabetes mellitus without complications: Secondary | ICD-10-CM

## 2014-09-20 DIAGNOSIS — D649 Anemia, unspecified: Secondary | ICD-10-CM

## 2014-09-20 DIAGNOSIS — I509 Heart failure, unspecified: Secondary | ICD-10-CM

## 2014-09-20 DIAGNOSIS — N182 Chronic kidney disease, stage 2 (mild): Secondary | ICD-10-CM

## 2014-09-20 DIAGNOSIS — J810 Acute pulmonary edema: Secondary | ICD-10-CM

## 2014-09-20 DIAGNOSIS — F319 Bipolar disorder, unspecified: Secondary | ICD-10-CM

## 2014-09-20 LAB — CBC WITH DIFFERENTIAL/PLATELET
BASOS PCT: 1 % (ref 0–1)
Basophils Absolute: 0.1 10*3/uL (ref 0.0–0.1)
Eosinophils Absolute: 0.3 10*3/uL (ref 0.0–0.7)
Eosinophils Relative: 6 % — ABNORMAL HIGH (ref 0–5)
HCT: 26.5 % — ABNORMAL LOW (ref 39.0–52.0)
Hemoglobin: 8.6 g/dL — ABNORMAL LOW (ref 13.0–17.0)
LYMPHS ABS: 1.2 10*3/uL (ref 0.7–4.0)
LYMPHS PCT: 23 % (ref 12–46)
MCH: 24.7 pg — ABNORMAL LOW (ref 26.0–34.0)
MCHC: 32.5 g/dL (ref 30.0–36.0)
MCV: 76.1 fL — ABNORMAL LOW (ref 78.0–100.0)
MONOS PCT: 13 % — AB (ref 3–12)
Monocytes Absolute: 0.7 10*3/uL (ref 0.1–1.0)
Neutro Abs: 3 10*3/uL (ref 1.7–7.7)
Neutrophils Relative %: 57 % (ref 43–77)
PLATELETS: 295 10*3/uL (ref 150–400)
RBC: 3.48 MIL/uL — AB (ref 4.22–5.81)
RDW: 16.3 % — AB (ref 11.5–15.5)
WBC: 5.3 10*3/uL (ref 4.0–10.5)

## 2014-09-20 LAB — FERRITIN: FERRITIN: 207 ng/mL (ref 22–322)

## 2014-09-20 LAB — BASIC METABOLIC PANEL
ANION GAP: 9 (ref 5–15)
BUN: 15 mg/dL (ref 6–23)
CO2: 23 mmol/L (ref 19–32)
Calcium: 7.9 mg/dL — ABNORMAL LOW (ref 8.4–10.5)
Chloride: 109 mmol/L (ref 96–112)
Creatinine, Ser: 2.32 mg/dL — ABNORMAL HIGH (ref 0.50–1.35)
GFR calc Af Amer: 35 mL/min — ABNORMAL LOW (ref >90)
GFR calc non Af Amer: 31 mL/min — ABNORMAL LOW (ref >90)
Glucose, Bld: 189 mg/dL — ABNORMAL HIGH (ref 70–99)
Potassium: 3.6 mmol/L (ref 3.5–5.1)
Sodium: 141 mmol/L (ref 135–145)

## 2014-09-20 LAB — GLUCOSE, CAPILLARY
GLUCOSE-CAPILLARY: 99 mg/dL (ref 70–99)
Glucose-Capillary: 151 mg/dL — ABNORMAL HIGH (ref 70–99)
Glucose-Capillary: 215 mg/dL — ABNORMAL HIGH (ref 70–99)

## 2014-09-20 LAB — TROPONIN I
Troponin I: 0.04 ng/mL — ABNORMAL HIGH (ref <0.031)
Troponin I: 0.03 ng/mL (ref <0.031)

## 2014-09-20 LAB — CBC
HCT: 25.9 % — ABNORMAL LOW (ref 39.0–52.0)
HEMOGLOBIN: 8.2 g/dL — AB (ref 13.0–17.0)
MCH: 24.3 pg — AB (ref 26.0–34.0)
MCHC: 31.7 g/dL (ref 30.0–36.0)
MCV: 76.6 fL — ABNORMAL LOW (ref 78.0–100.0)
Platelets: 294 10*3/uL (ref 150–400)
RBC: 3.38 MIL/uL — ABNORMAL LOW (ref 4.22–5.81)
RDW: 16.3 % — ABNORMAL HIGH (ref 11.5–15.5)
WBC: 6.4 10*3/uL (ref 4.0–10.5)

## 2014-09-20 LAB — MRSA PCR SCREENING: MRSA by PCR: NEGATIVE

## 2014-09-20 MED ORDER — PALIPERIDONE ER 6 MG PO TB24: 9.0000 mg | ORAL_TABLET | Freq: Every day | ORAL | Status: DC

## 2014-09-20 MED ORDER — PALIPERIDONE ER 6 MG PO TB24
6.0000 mg | ORAL_TABLET | Freq: Every day | ORAL | Status: DC
  Administered 2014-09-20: 6 mg via ORAL
  Filled 2014-09-20 (×2): qty 1

## 2014-09-20 MED ORDER — CETYLPYRIDINIUM CHLORIDE 0.05 % MT LIQD
7.0000 mL | Freq: Two times a day (BID) | OROMUCOSAL | Status: DC
  Administered 2014-09-20 – 2014-09-21 (×3): 7 mL via OROMUCOSAL

## 2014-09-20 NOTE — Progress Notes (Signed)
[facility] PROGRESS NOTE  Peter Keyworth ZOX:[id number] DOB: April 09, 1961 DOA: 09/19/2014 PCP: Burtis Junes, MD  Assessment/Plan:  Pulmonary edema/SOB - most like due to fluid overload continue Lasix 60 mgBID -Daily weights admission weight standing= 106.6 kg           1/24 weight standing= 103.6 kg -Strict in and out since admission-61ml (most likely not accurate) -Echocardiogram pending  -TSH within normal limit - x ray with pulm opacities- repeat x ray in AM after diuresis- may need CT scan -BNP elevate -cycle CE  DM type 2 controlled  -1/22 hemoglobin A1c = 6.6  -Continue sensitive SSI  Schizoaffective d/o - continue Invega 6 mg daily, then increase to 9 mg daily on 1/25 -Continue Cogentin 1 mg daily  Bipolar 1 disorder -Continue Tegretol 200 mg BID  CKD - baseline around 2.5, monitor on lasix  Anemia- - monitor, - Fe panel pending   Code Status: Full Family Communication: None Disposition Plan: Resolution SOB   Consultants:  None   Procedures: Echocardiogram pending   Cultures NA   Antibiotics: NA   DVT prophylaxis Subcutaneous heparin   HPI/Subjective: Avyukt Cimo is a 54 y.o. BM PMHx bipolar 1 disorder, schizophrenia, HTN, diabetes type 2 without complication,  CAD with stents. Who was brought in from a group home. He has been having SOB for > 1 year but over the last few days he has been unable to lay flat and says his legs were swelling. He also says he had some chest pain. Described as intermittent. No fevers, no chills, no productive cough. When EMS arrived, his O2 sat was in the 50% on RA. Patient continues to smoke. He initially was placed on CPAP and was given IV Lasix and weaned to [facility]. He had 2 full urinals when I examined patient.   In the ER, BNP was elevated and x ray was suggestive of pulm edema. No prior hx of CHF per patient  1/23  A/O 2 (does not know when, why) no complaints. Patient does not know  dry weight (does not weigh herself daily). States positive SOB/tightness in chest starting last week  Objective: Filed Vitals:   09/20/14 0612 09/20/14 0837 09/20/14 0838 09/20/14 1120  BP: 134/72   151/78  Pulse: 78   82  Temp: 97.4 F (36.3 C)     TempSrc: Oral     Resp: 18   18  Height:      Weight: 106.686 kg (235 lb 3.2 oz)     SpO2: 100% 100% 100% 98%    Intake/Output Summary (Last 24 hours) at 09/20/14 1232 Last data filed at 09/20/14 1123  Gross per 24 hour  Intake   1733 ml  Output   3150 ml  Net  -1417 ml   Filed Weights   09/19/14 1928 09/20/14 0612  Weight: 109.907 kg (242 lb 4.8 oz) 106.686 kg (235 lb 3.2 oz)     Exam: General: A/O 2 (did not know when or why), NAD No acute respiratory distress Lungs: Clear to auscultation bilaterally without wheezes or crackles Cardiovascular: Regular rate and rhythm without murmur gallop or rub normal S1 and S2 Abdomen: Nontender, nondistended, soft, bowel sounds positive, no rebound, no ascites, no appreciable mass Extremities: No significant cyanosis, clubbing. Bilateral lower extremity edema1-2+   Data Reviewed: Basic Metabolic Panel:  Recent Labs Lab 09/19/14 1539 09/20/14 0137  NA 141 141  K 4.1 3.6  CL 109 109  CO2 26 23  GLUCOSE 215* 189*  BUN  14 15  CREATININE 2.16* 2.32*  CALCIUM 8.0* 7.9*   Liver Function Tests: No results for input(s): AST, ALT, ALKPHOS, BILITOT, PROT, ALBUMIN in the last 168 hours. No results for input(s): LIPASE, AMYLASE in the last 168 hours. No results for input(s): AMMONIA in the last 168 hours. CBC:  Recent Labs Lab 09/19/14 1539 09/20/14 0137  WBC 6.7 6.4  NEUTROABS 4.6  --   HGB 9.0* 8.2*  HCT 28.2* 25.9*  MCV 76.6* 76.6*  PLT 304 294   Cardiac Enzymes:  Recent Labs Lab 09/19/14 1937 09/20/14 0137 09/20/14 0726  TROPONINI 0.03 0.04* 0.03   BNP (last 3 results) No results for input(s): PROBNP in the last 8760 hours. CBG:  Recent Labs Lab  09/19/14 2055 09/20/14 0644 09/20/14 1157  GLUCAP 291* 151* 99    Recent Results (from the past 240 hour(s))  MRSA PCR Screening     Status: None   Collection Time: 09/20/14  8:32 AM  Result Value Ref Range Status   MRSA by PCR NEGATIVE NEGATIVE Final    Comment:        The GeneXpert MRSA Assay (FDA approved for NASAL specimens only), is one component of a comprehensive MRSA colonization surveillance program. It is not intended to diagnose MRSA infection nor to guide or monitor treatment for MRSA infections.      Studies: Dg Chest 2 View  09/20/2014   CLINICAL DATA:  Shortness of breath.  Nonproductive cough.  EXAM: CHEST  2 VIEW  COMPARISON:  09/19/2014  FINDINGS: Increasing right basilar airspace opacity. Continued bilateral interstitial opacities and perihilar airspace opacities with associated airway thickening. Somewhat the nodular left perihilar opacity is reduced. Trace blunting of both posterior costophrenic angles. Thoracic spondylosis. Small amount of fluid in the minor fissure. Kerley B-lines noted. Mild cardiomegaly.  IMPRESSION: 1. Mild cardiomegaly with bilateral airway thickening, interstitial accentuation, mild perihilar airspace opacities, and confluent right basilar airspace opacity. Small bilateral pleural effusions. Appearance favors acute pulmonary edema, although the possibility of superimposed right basilar pneumonia or atypical pneumonia are not completely discarded.   Electronically Signed   By: Herbie BaltimoreWalt  Liebkemann M.D.   On: 09/20/2014 08:20   Dg Chest Portable 1 View  09/19/2014   ADDENDUM REPORT: 09/19/2014 16:33  ADDENDUM: These results were called by telephone at the time of interpretation on 09/19/2014 at 4:33 pm to Dr. Ames DuraSTEPHEN BALLEH , who verbally acknowledged these results.   Electronically Signed   By: Annia Beltrew  Davis M.D.   On: 09/19/2014 16:33   09/19/2014   CLINICAL DATA:  Patient with respiratory distress. History of hypertension and diabetes. Nonsmoker.   EXAM: PORTABLE CHEST - 1 VIEW  COMPARISON:  None.  FINDINGS: Cardiac and mediastinal contours upper limits of normal. Diffuse bilateral heterogeneous pulmonary opacities are demonstrated. More focal consolidative opacity demonstrated within the left upper lung. Possible tiny bilateral pleural effusions. No pneumothorax. Monitoring leads overlie the patient.  IMPRESSION: Diffuse bilateral heterogeneous pulmonary opacities may represent pulmonary edema or potentially multi focal infection. More focal consolidative opacity within the left upper lung may be related to above described acute process however underlying mass is a consideration. Recommend short-term followup to ensure resolution. If this persists, chest CT would be warranted.  Possible tiny bilateral pleural effusions.  Electronically Signed: By: Annia Beltrew  Davis M.D. On: 09/19/2014 16:26    Scheduled Meds: . atorvastatin  10 mg Oral QHS  . benztropine  1 mg Oral Daily  . carbamazepine  200 mg Oral BID PC  .  cloNIDine  0.2 mg Oral BID  . clopidogrel  75 mg Oral [id number]  . furosemide  60 mg Intravenous BID  . heparin  5,000 Units Subcutaneous 3 times per day  . insulin aspart  0-5 Units Subcutaneous QHS  . insulin aspart  0-9 Units Subcutaneous TID WC  . metoprolol  75 mg Oral BID  . nicotine  14 mg Transdermal Daily  . paliperidone  9 mg Oral Daily  . potassium chloride  10 mEq Oral Daily  . sodium chloride  3 mL Intravenous Q12H  . sodium chloride  3 mL Intravenous Q12H   Continuous Infusions:   Active Problems:   Schizoaffective disorder   DM (diabetes mellitus)   Chronic kidney disease   SOB (shortness of breath)   Tobacco abuse    Time spent: 40 minutes    WOODS, CURTIS, J  [facility] Pager [phone]. If 7PM-7AM, please contact night-coverage at [email or web address], password [facility] 09/20/2014, 12:32 PM  LOS: 1 day

## 2014-09-20 NOTE — Progress Notes (Signed)
Pt is refusing blood draws for labs

## 2014-09-20 NOTE — Progress Notes (Signed)
Echocardiogram 2D Echocardiogram has been performed.  [name] 09/20/2014, 12:37 PM

## 2014-09-21 DIAGNOSIS — E119 Type 2 diabetes mellitus without complications: Secondary | ICD-10-CM | POA: Insufficient documentation

## 2014-09-21 DIAGNOSIS — I429 Cardiomyopathy, unspecified: Secondary | ICD-10-CM | POA: Insufficient documentation

## 2014-09-21 DIAGNOSIS — F319 Bipolar disorder, unspecified: Secondary | ICD-10-CM | POA: Insufficient documentation

## 2014-09-21 DIAGNOSIS — D649 Anemia, unspecified: Secondary | ICD-10-CM | POA: Insufficient documentation

## 2014-09-21 DIAGNOSIS — D509 Iron deficiency anemia, unspecified: Secondary | ICD-10-CM

## 2014-09-21 DIAGNOSIS — I5032 Chronic diastolic (congestive) heart failure: Secondary | ICD-10-CM | POA: Insufficient documentation

## 2014-09-21 DIAGNOSIS — J810 Acute pulmonary edema: Secondary | ICD-10-CM | POA: Insufficient documentation

## 2014-09-21 LAB — COMPREHENSIVE METABOLIC PANEL
ALT: 25 U/L (ref 0–53)
AST: 14 U/L (ref 0–37)
Albumin: 1.8 g/dL — ABNORMAL LOW (ref 3.5–5.2)
Alkaline Phosphatase: 83 U/L (ref 39–117)
Anion gap: 7 (ref 5–15)
BUN: 22 mg/dL (ref 6–23)
CALCIUM: 7.7 mg/dL — AB (ref 8.4–10.5)
CO2: 28 mmol/L (ref 19–32)
Chloride: 103 mmol/L (ref 96–112)
Creatinine, Ser: 2.53 mg/dL — ABNORMAL HIGH (ref 0.50–1.35)
GFR, EST AFRICAN AMERICAN: 32 mL/min — AB (ref >90)
GFR, EST NON AFRICAN AMERICAN: 28 mL/min — AB (ref >90)
GLUCOSE: 228 mg/dL — AB (ref 70–99)
Potassium: 3.5 mmol/L (ref 3.5–5.1)
Sodium: 138 mmol/L (ref 135–145)
TOTAL PROTEIN: 5.5 g/dL — AB (ref 6.0–8.3)
Total Bilirubin: 0.5 mg/dL (ref 0.3–1.2)

## 2014-09-21 LAB — GLUCOSE, CAPILLARY
GLUCOSE-CAPILLARY: 168 mg/dL — AB (ref 70–99)
GLUCOSE-CAPILLARY: 182 mg/dL — AB (ref 70–99)
Glucose-Capillary: 216 mg/dL — ABNORMAL HIGH (ref 70–99)

## 2014-09-21 LAB — MAGNESIUM: MAGNESIUM: 1.8 mg/dL (ref 1.5–2.5)

## 2014-09-21 MED ORDER — NICOTINE 14 MG/24HR TD PT24: 14.0000 mg | MEDICATED_PATCH | Freq: Every day | TRANSDERMAL | Status: DC

## 2014-09-21 MED ORDER — PALIPERIDONE ER 6 MG PO TB24
9.0000 mg | ORAL_TABLET | Freq: Every day | ORAL | Status: DC
  Filled 2014-09-21: qty 1

## 2014-09-21 MED ORDER — PALIPERIDONE ER 6 MG PO TB24: 9.0000 mg | ORAL_TABLET | Freq: Every day | ORAL | Status: DC

## 2014-09-21 MED ORDER — VITAMIN C 500 MG PO TABS
500.0000 mg | ORAL_TABLET | Freq: Three times a day (TID) | ORAL | Status: DC
  Administered 2014-09-21: 500 mg via ORAL
  Filled 2014-09-21 (×2): qty 1

## 2014-09-21 MED ORDER — ASCORBIC ACID 500 MG PO TABS: 500.0000 mg | ORAL_TABLET | Freq: Three times a day (TID) | ORAL | Status: DC

## 2014-09-21 MED ORDER — FUROSEMIDE 40 MG PO TABS: 40.0000 mg | ORAL_TABLET | Freq: Every day | ORAL | Status: DC

## 2014-09-21 MED ORDER — FERROUS SULFATE 325 (65 FE) MG PO TABS: 325.0000 mg | ORAL_TABLET | Freq: Three times a day (TID) | ORAL | Status: AC

## 2014-09-21 MED ORDER — FUROSEMIDE 10 MG/ML IJ SOLN: 60.0000 mg | Freq: Two times a day (BID) | INTRAMUSCULAR | Status: DC

## 2014-09-21 MED ORDER — FERROUS SULFATE 325 (65 FE) MG PO TABS
325.0000 mg | ORAL_TABLET | Freq: Three times a day (TID) | ORAL | Status: DC
  Administered 2014-09-21: 325 mg via ORAL
  Filled 2014-09-21 (×2): qty 1

## 2014-09-21 NOTE — Discharge Summary (Signed)
Physician Discharge Summary  [name] ZOX:[id number] DOB: 04-05-1961 DOA: 09/19/2014  PCP: Burtis Junes, MD  Admit date: 09/19/2014 Discharge date: 09/21/2014  Time spent: 40 minutes  Recommendations for Outpatient Follow-up:  Pulmonary edema/SOB -continue Lasix 40 mg daily; heart clinic to titrate as required -Daily weights1/24 weight standing at discharge = 103.6 kg. patient to weigh himself as soon as he is at home, and then each a.m. maintain a log book of weights -Echocardiogram shows patient to have cardiomyopathy with preserved ejection fraction. Suggestive of amyloidosis. Will have patient follow-up with heart failure clinic Dr. Nicholes Mango   Cardiomyopathy/diastolic CHF -Will discharge patient on Lasix 40 mg daily -Continue metoprolol 75 mg BID -Patient would not tolerate starting ARB/ACE inhibitor secondary to his chronic renal failure. -Patient does not appear to have previously seen cardiologist will have him establish care with [facility].  Dr. Nicholes Mango in the heart failure clinic  Perihilar airspace opacities -Significance unknown;repeat CXR in 2-4 weeks evaluate for resolution  DM type 2 controlled  -1/22 hemoglobin A1c = 6.6  -Restart home regimen of insulin   Schizoaffective d/o - discharge on Invega 9 mg daily (will start 1 day early) -Continue Cogentin 1 mg daily  Bipolar 1 disorder -Continue Tegretol 200 mg BID  Chronic [name] failure - baseline around 2.5,  -Patient at baseline on day of discharge   Iron deficiency anemia  - FE/TIBC ratio = 0.08 which is consistent with iron deficiency anemia - Discharge on iron 325 mg TID -Vitamin C 500 mg TID -PCP to recheck in 6-8 weeks for improvement      Discharge Diagnoses:  Active Problems:   Schizoaffective disorder   DM (diabetes mellitus)   Chronic [name] disease   SOB (shortness of breath)   Tobacco abuse   Acute pulmonary edema   Absolute anemia   Diabetes type 2, controlled    Bipolar 1 disorder   Discharge Condition: Stable  Diet recommendation: American diabetic Association  Filed Weights   09/19/14 1928 09/20/14 0612 09/21/14 0535  Weight: 109.907 kg (242 lb 4.8 oz) 106.686 kg (235 lb 3.2 oz) 103.647 kg (228 lb 8 oz)    History of present illness:  [name] is a 54 y.o. BM PMHx bipolar 1 disorder, schizophrenia, HTN, diabetes type 2 without complication,  CAD with stents. Who was brought in from a group home. He has been having SOB for > 1 year but over the last few days he has been unable to lay flat and says his legs were swelling. He also says he had some chest pain. Described as intermittent. No fevers, no chills, no productive cough. When EMS arrived, his O2 sat was in the 50% on RA. Patient continues to smoke. He initially was placed on CPAP and was given IV Lasix and weaned to [facility]. He had 2 full urinals when I examined patient.   In the ER, BNP was elevated and x ray was suggestive of pulm edema. 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 Patient was diuresed aggressively and his SOB resolved. In addition patient was found to have iron deficiency anemia and started on appropriate regimen.    Procedures: 1/23 CXR; Mild cardiomegaly with bilateral airway thickening, interstitial Accentuation. -, mild perihilar airspace opacities, and confluent right basilar airspace opacity.  1/23 echocardiogram;Left ventricle: severe concentric hypertrophy.  -speckled appearance to intraventricular septum (amyloidosis?) -LVEF= 60% to 65%.  - (grade 2  diastolic dysfunction).  - Left atrium: Moderately dilated at 39 ml/m2. - Right ventricle:  moderately dilated.. - Inferior vena cava: The vessel was dilated. The respirophasic diameter changes were blunted (< 50%), consistent with elevated central venous  pressure.    Consultations: NA  Antibiotics NA   Discharge Exam: Filed Vitals:   09/21/14 0940 09/21/14 0942 09/21/14 0947 09/21/14 0955  BP: 109/57 150/78 122/73 125/66  Pulse: 67 70 72 71  Temp: 98 F (36.7 C)     TempSrc: Oral     Resp: Height:      Weight:      SpO2: 98%       General: A/O 2 (did not know when or why), NAD No acute respiratory distress Lungs: Clear to auscultation bilaterally without wheezes or crackles Cardiovascular: Regular rate and rhythm without murmur gallop or rub normal S1 and S2 Abdomen: Nontender, nondistended, soft, bowel sounds positive, no rebound, no ascites, no appreciable mass Extremities: No significant cyanosis, clubbing. Bilateral lower extremity edema1+   Discharge Instructions     Medication List    ASK your doctor about these medications        amLODipine 10 MG tablet  Commonly known as:  NORVASC  Take 1 tablet (10 mg total) by mouth daily.     atorvastatin 10 MG tablet  Commonly known as:  LIPITOR  Take 1 tablet (10 mg total) by mouth at bedtime.     benztropine 1 MG tablet  Commonly known as:  COGENTIN  Take 1 tablet (1 mg total) by mouth daily.     carbamazepine 200 MG tablet  Commonly known as:  TEGRETOL  Take 1 tablet (200 mg total) by mouth 2 (two) times daily after a meal.     cloNIDine 0.2 MG tablet  Commonly known as:  CATAPRES  Take 1 tablet (0.2 mg total) by mouth 2 (two) times daily.     clopidogrel 75 MG tablet  Commonly known as:  PLAVIX  Take 1 tablet (75 mg total) by mouth every morning.     haloperidol decanoate 100 MG/ML injection  Commonly known as:  HALDOL DECANOATE  Inject 1 mL (100 mg total) into the muscle every 28 (twenty-eight) days.     hydrALAZINE 25 MG tablet  Commonly known as:  APRESOLINE  Take 1 tablet (25 mg total) by mouth 3 (three) times daily. Does not take systolic is >100     LORazepam 1 MG tablet  Commonly known as:  ATIVAN  Take 1 mg by mouth every 6  (six) hours as needed for anxiety (anxiety).     metoprolol 50 MG tablet  Commonly known as:  LOPRESSOR  Take 75 mg by mouth 2 (two) times daily.     NOVOLOG FLEXPEN 100 UNIT/ML FlexPen  Generic drug:  insulin aspart  Inject 2-10 Units into the skin 3 (three) times daily with meals. According to Sliding Scale  If blood sugar level is: 151-200= 2 units; 201-250= 4 units; 251-300= 6 units; 301-350= 8 units and  If 351-400= 10 units     paliperidone 9 MG 24 hr tablet  Commonly known as:  INVEGA  Take 1 tablet (9 mg total) by mouth daily.     potassium chloride 10 MEQ tablet  Commonly known as:  K-DUR,KLOR-CON  Take 1 tablet (10 mEq total) by mouth daily.     traZODone 50 MG tablet  Commonly known as:  DESYREL  Take 1 tablet (50 mg total)  by mouth at bedtime.       No Known Allergies    The results of significant diagnostics from this hospitalization (including imaging, microbiology, ancillary and laboratory) are listed below for reference.    Significant Diagnostic Studies: Dg Chest 2 View  09/20/2014   CLINICAL DATA:  Shortness of breath.  Nonproductive cough.  EXAM: CHEST  2 VIEW  COMPARISON:  09/19/2014  FINDINGS: Increasing right basilar airspace opacity. Continued bilateral interstitial opacities and perihilar airspace opacities with associated airway thickening. Somewhat the nodular left perihilar opacity is reduced. Trace blunting of both posterior costophrenic angles. Thoracic spondylosis. Small amount of fluid in the minor fissure. Kerley B-lines noted. Mild cardiomegaly.  IMPRESSION: 1. Mild cardiomegaly with bilateral airway thickening, interstitial accentuation, mild perihilar airspace opacities, and confluent right basilar airspace opacity. Small bilateral pleural effusions. Appearance favors acute pulmonary edema, although the possibility of superimposed right basilar pneumonia or atypical pneumonia are not completely discarded.   Electronically Signed   By: Herbie BaltimoreWalt   Liebkemann M.D.   On: 09/20/2014 08:20   Dg Chest Portable 1 View  09/19/2014   ADDENDUM REPORT: 09/19/2014 16:33  ADDENDUM: These results were called by telephone at the time of interpretation on 09/19/2014 at 4:33 pm to Dr. Ames DuraSTEPHEN BALLEH , who verbally acknowledged these results.   Electronically Signed   By: Annia Beltrew  Davis M.D.   On: 09/19/2014 16:33   09/19/2014   CLINICAL DATA:  Patient with respiratory distress. History of hypertension and diabetes. Nonsmoker.  EXAM: PORTABLE CHEST - 1 VIEW  COMPARISON:  None.  FINDINGS: Cardiac and mediastinal contours upper limits of normal. Diffuse bilateral heterogeneous pulmonary opacities are demonstrated. More focal consolidative opacity demonstrated within the left upper lung. Possible tiny bilateral pleural effusions. No pneumothorax. Monitoring leads overlie the patient.  IMPRESSION: Diffuse bilateral heterogeneous pulmonary opacities may represent pulmonary edema or potentially multi focal infection. More focal consolidative opacity within the left upper lung may be related to above described acute process however underlying mass is a consideration. Recommend short-term followup to ensure resolution. If this persists, chest CT would be warranted.  Possible tiny bilateral pleural effusions.  Electronically Signed: By: Annia Beltrew  Davis M.D. On: 09/19/2014 16:26    Microbiology: Recent Results (from the past 240 hour(s))  MRSA PCR Screening     Status: None   Collection Time: 09/20/14  8:32 AM  Result Value Ref Range Status   MRSA by PCR NEGATIVE NEGATIVE Final    Comment:        The GeneXpert MRSA Assay (FDA approved for NASAL specimens only), is one component of a comprehensive MRSA colonization surveillance program. It is not intended to diagnose MRSA infection nor to guide or monitor treatment for MRSA infections.      Labs: Basic Metabolic Panel:  Recent Labs Lab 09/19/14 1539 09/20/14 0137 09/21/14 0447  NA 141 141 138  K 4.1 3.6 3.5   CL 109 109 103  CO2 26 23 28   GLUCOSE 215* 189* 228*  BUN 14 15 22   CREATININE 2.16* 2.32* 2.53*  CALCIUM 8.0* 7.9* 7.7*  MG  --   --  1.8   Liver Function Tests:  Recent Labs Lab 09/21/14 0447  AST 14  ALT 25  ALKPHOS 83  BILITOT 0.5  PROT 5.5*  ALBUMIN 1.8*   No results for input(s): LIPASE, AMYLASE in the last 168 hours. No results for input(s): AMMONIA in the last 168 hours. CBC:  Recent Labs Lab 09/19/14 1539 09/20/14 0137 09/20/14 1922  WBC 6.7 6.4 5.3  NEUTROABS 4.6  --  3.0  HGB 9.0* 8.2* 8.6*  HCT 28.2* 25.9* 26.5*  MCV 76.6* 76.6* 76.1*  PLT 304 294 295   Cardiac Enzymes:  Recent Labs Lab 09/19/14 1937 09/20/14 0137 09/20/14 0726  TROPONINI 0.03 0.04* 0.03   BNP: BNP (last 3 results) No results for input(s): PROBNP in the last 8760 hours. CBG:  Recent Labs Lab 09/20/14 1157 09/20/14 1557 09/20/14 2107 09/21/14 0553 09/21/14 1203  GLUCAP 99 215* 168* 216* 182*       Signed:  Carolyne Littles, MD [facility] [phone] pager   Care during the described time interval was provided by me. I have reviewed this patient's available data, including medical history, events of note, physical examination, radiology findings and test results as part of my evaluation  Carolyne Littles, MD [phone] Pager

## 2014-09-21 NOTE — Progress Notes (Signed)
SATURATION QUALIFICATIONS: (This note is used to comply with regulatory documentation for home oxygen)  Patient Saturations on Room Air at Rest = 100*%  Patient Saturations on Room Air while Ambulating = 97%  Patient Saturations on 0 Liters of oxygen while Ambulating = 97%   Andrew AuVafiadis, Coleby Yett I 09/21/2014 8:17 AM

## 2014-09-21 NOTE — Progress Notes (Signed)
Report given to [name], [facility]. Pt to be transported group home via ambulance. CSW working on Electronics engineergathering documents. Will monitor   [name], [name] I 09/21/2014 2:11 PM

## 2014-09-21 NOTE — Clinical Social Work Note (Signed)
Per RN patient will require transport back to rest home. Patient has not been seen or worked up for return to facility. CSW has complete FL2. Signed FL2, DC Summary, and DC packet taken to unit. RN given number for report. Facility is able to accept patient back today. Next available ambulance transport requested for patient. CSW met with patient at bedside, patient agreeable to return to facility. CSW unable to complete full assessment, due to timing of consult. CSW signing off.   Liz Beach MSW, [place], [place], [phone]

## 2014-09-21 NOTE — Progress Notes (Signed)
Pt escorted / walked out of unit with ambulance personnel. No acute distress noted. Belongings in hand.  Andrew AuVafiadis, Malania Gawthrop I 09/21/2014 4:34 PM

## 2014-09-24 ENCOUNTER — Ambulatory Visit (HOSPITAL_COMMUNITY)
Admission: RE | Admit: 2014-09-24 | Discharge: 2014-09-24 | Disposition: A | Discharge status: Home / Self Care | Payer: Medicare Other | Source: Ambulatory Visit | Attending: Internal Medicine | Admitting: Internal Medicine

## 2014-09-24 ENCOUNTER — Encounter (HOSPITAL_COMMUNITY): Payer: Self-pay

## 2014-09-24 VITALS — BP 149/89 | HR 81 | Resp 20 | Wt 242.2 lb

## 2014-09-24 DIAGNOSIS — F17210 Nicotine dependence, cigarettes, uncomplicated: Secondary | ICD-10-CM | POA: Diagnosis not present

## 2014-09-24 DIAGNOSIS — E119 Type 2 diabetes mellitus without complications: Secondary | ICD-10-CM | POA: Insufficient documentation

## 2014-09-24 DIAGNOSIS — F250 Schizoaffective disorder, bipolar type: Secondary | ICD-10-CM | POA: Diagnosis not present

## 2014-09-24 DIAGNOSIS — I129 Hypertensive chronic kidney disease with stage 1 through stage 4 chronic kidney disease, or unspecified chronic kidney disease: Secondary | ICD-10-CM | POA: Diagnosis not present

## 2014-09-24 DIAGNOSIS — Z79899 Other long term (current) drug therapy: Secondary | ICD-10-CM | POA: Insufficient documentation

## 2014-09-24 DIAGNOSIS — Z7902 Long term (current) use of antithrombotics/antiplatelets: Secondary | ICD-10-CM | POA: Insufficient documentation

## 2014-09-24 DIAGNOSIS — I5032 Chronic diastolic (congestive) heart failure: Secondary | ICD-10-CM | POA: Diagnosis present

## 2014-09-24 DIAGNOSIS — Z794 Long term (current) use of insulin: Secondary | ICD-10-CM | POA: Diagnosis not present

## 2014-09-24 DIAGNOSIS — N189 Chronic kidney disease, unspecified: Secondary | ICD-10-CM | POA: Insufficient documentation

## 2014-09-24 DIAGNOSIS — N183 Chronic kidney disease, stage 3 unspecified: Secondary | ICD-10-CM

## 2014-09-24 DIAGNOSIS — I2510 Atherosclerotic heart disease of native coronary artery without angina pectoris: Secondary | ICD-10-CM | POA: Diagnosis not present

## 2014-09-24 DIAGNOSIS — I10 Essential (primary) hypertension: Secondary | ICD-10-CM | POA: Insufficient documentation

## 2014-09-24 NOTE — Patient Instructions (Signed)
Follow up in 4 weeks  Do the following things EVERYDAY: 1) Weigh yourself in the morning before breakfast. Write it down and keep it in a log. 2) Take your medicines as prescribed 3) Eat low salt foods-Limit salt (sodium) to 2000 mg per day.  4) Stay as active as you can everyday 5) Limit all fluids for the day to less than 2 liters 

## 2014-09-24 NOTE — Progress Notes (Signed)
Patient ID: [name], male   DOB: 12/18/1960, 54 y.o.   MRN: [id number] Referring Physician:Dr Joseph ArtWoods Primary Care: Primary Cardiologist: None   HPI: [name] is a 54 year old with a history of Schizoaffective disorder, DMII, CKD creatinine baseline 2.5, poorly controlled HTN, CKD, CAD says he has a stent placed in [place] and diastolic HF.  Ad[facility] with increased dyspnea. Diuresed with IV lasix and transitioned to po lasix 40 mg daily.  Discharge weight 228 pounds.    He presents as new patient at the request of Dr Joseph ArtWoods for HF. Lives at Group Home. Smoking 1/2-1PPD. Denies SOB/PND/Orthopnea. Not weighing daily.  Not taking BP of checking glucometer at group home. Not taking all medications. Not on a special diet.  Says he doesn't want to see more doctors. Feels overmedicated.   ECHO EF 60-65% severe LVH Grade II DD.   Labs 09/21/14 K 3.5 Creatinine 2.53    Review of Systems: [y] = yes, [ ]  = no   General: Weight gain [ ] ; Weight loss [ ] ; Anorexia [ ] ; Fatigue [Y ]; Fever [ ] ; Chills [ ] ; Weakness [ ]   Cardiac: Chest pain/pressure [ ] ; Resting SOB [ ] ; Exertional SOB [ ] ; Orthopnea [ ] ; Pedal Edema [ ] ; Palpitations [ ] ; Syncope [ ] ; Presyncope [ ] ; Paroxysmal nocturnal dyspnea[ ]   Pulmonary: Cough [ ] ; Wheezing[ ] ; Hemoptysis[ ] ; Sputum [ ] ; Snoring [ ]   GI: Vomiting[ ] ; Dysphagia[ ] ; Melena[ ] ; Hematochezia [ ] ; Heartburn[ ] ; Abdominal pain [ ] ; Constipation [ ] ; Diarrhea [ ] ; BRBPR [ ]   GU: Hematuria[ ] ; Dysuria [ ] ; Nocturia[ ]   Vascular: Pain in legs with walking [ ] ; Pain in feet with lying flat [ ] ; Non-healing sores [ ] ; Stroke [ ] ; TIA [ ] ; Slurred speech [ ] ;  Neuro: Headaches[ ] ; Vertigo[ ] ; Seizures[ ] ; Paresthesias[ ] ;Blurred vision [ ] ; Diplopia [ ] ; Vision changes [ ]   Ortho/Skin: Arthritis [ ] ; Joint pain [Y ]; Muscle pain [ ] ; Joint swelling [ ] ; Back Pain [ ] ; Rash [ ]   Psych: Depression[Y ]; Anxiety[Y ]  Heme: Bleeding problems [  ]; Clotting disorders [ ] ; Anemia [ ]   Endocrine: Diabetes [Y ]; Thyroid dysfunction[ ]    Past Medical History  Diagnosis Date  . Bipolar 1 disorder   . Schizophrenia   . Hypertension   . Diabetes mellitus without complication     Current Outpatient Prescriptions  Medication Sig Dispense Refill  . atorvastatin (LIPITOR) 10 MG tablet Take 1 tablet (10 mg total) by mouth at bedtime.    . benztropine (COGENTIN) 1 MG tablet Take 1 tablet (1 mg total) by mouth daily. 30 tablet 0  . carbamazepine (TEGRETOL) 200 MG tablet Take 1 tablet (200 mg total) by mouth 2 (two) times daily after a meal. 60 tablet 0  . cloNIDine (CATAPRES) 0.2 MG tablet Take 1 tablet (0.2 mg total) by mouth 2 (two) times daily. 60 tablet 11  . clopidogrel (PLAVIX) 75 MG tablet Take 1 tablet (75 mg total) by mouth every morning.    . ferrous sulfate 325 (65 FE) MG tablet Take 1 tablet (325 mg total) by mouth 3 (three) times daily with meals. (Patient not taking: Reported on 09/24/2014) 90 tablet 0  . furosemide (LASIX) 10 MG/ML injection Inject 6 mLs (60 mg total) into the vein 2 (two) times daily. 4 mL 0  . furosemide (LASIX) 40 MG tablet Take 1 tablet (40 mg total)  by mouth daily. 30 tablet 0  . haloperidol decanoate (HALDOL DECANOATE) 100 MG/ML injection Inject 1 mL (100 mg total) into the muscle every 28 (twenty-eight) days. 1 mL 0  . insulin aspart (NOVOLOG FLEXPEN) 100 UNIT/ML FlexPen Inject 2-10 Units into the skin 3 (three) times daily with meals. According to Sliding Scale  If blood sugar level is: 151-200= 2 units; 201-250= 4 units; 251-300= 6 units; 301-350= 8 units and  If 351-400= 10 units    . LORazepam (ATIVAN) 1 MG tablet Take 1 mg by mouth every 6 (six) hours as needed for anxiety (anxiety).    . metoprolol (LOPRESSOR) 50 MG tablet Take 75 mg by mouth 2 (two) times daily.    . nicotine (NICODERM CQ - DOSED IN MG/24 HOURS) 14 mg/24hr patch Place 1 patch (14 mg total) onto the skin daily. (Patient not taking:  Reported on 09/24/2014) 28 patch 0  . paliperidone (INVEGA) 9 MG 24 hr tablet Take 1 tablet (9 mg total) by mouth daily. (Patient not taking: Reported on 09/24/2014) 30 tablet 0  . potassium chloride (K-DUR,KLOR-CON) 10 MEQ tablet Take 1 tablet (10 mEq total) by mouth daily.    . traZODone (DESYREL) 50 MG tablet Take 1 tablet (50 mg total) by mouth at bedtime. (Patient taking differently: Take 50 mg by mouth at bedtime as needed. Take as needed for sleep) 30 tablet 0  . vitamin C (VITAMIN C) 500 MG tablet Take 1 tablet (500 mg total) by mouth 3 (three) times daily. (Patient not taking: Reported on 09/24/2014) 90 tablet 0   No current facility-administered medications for this encounter.    No Known Allergies    History   Social History  . Marital Status: Married    Spouse Name: N/A    Number of Children: N/A  . Years of Education: N/A   Occupational History  . Not on file.   Social History Main Topics  . Smoking status: Current Every Day Smoker -- 1.00 packs/day    Types: Cigarettes  . Smokeless tobacco: Not on file  . Alcohol Use: No  . Drug Use: No  . Sexual Activity: Not Currently   Other Topics Concern  . Not on file   Social History Narrative     No family history on file.  Filed Vitals:   09/24/14 1551  BP: 149/89  Pulse: 81  Resp: 20  Weight: 242 lb 4 oz (109.884 kg)  SpO2: 96%    PHYSICAL EXAM: General:  Well appearing. No respiratory difficulty HEENT: normal Neck: supple. JVD ~7-8 . Carotids 2+ bilat; no bruits. No lymphadenopathy or thryomegaly appreciated. Cor: PMI nondisplaced. Regular rate & rhythm. No rubs, gallops or murmurs. Lungs: clear Abdomen: soft, nontender, nondistended. No hepatosplenomegaly. No bruits or masses. Good bowel sounds. Extremities: no cyanosis, clubbing, rash, edema Neuro: alert & oriented x 3, cranial nerves grossly intact. moves all 4 extremities w/o difficulty. Affect pleasant.   ASSESSMENT & PLAN:  1. Chronic Diastolic  Heart Failure ECHO EF 60-65%  Volume status ok. Continue lasix 40 mg daily Reinforced need for fluid restriction < 2L per day. Suggested daily weights  2. Schizoaffective Disorder  3. CKD--Creatinine baseline 2.5   4. HTN- elevated Refuses med titration  5. CAD - stent in the past at [place]. On plavix daily and statin and BB.    Tonye Becket, NP-C  Patient seen and examined with Tonye Becket, NP. We discussed all aspects of the encounter. I agree with the assessment and plan  as stated above.   Volume status looks good after diuresis. Despite long discussion with him he refuses to take his medications regularly or follow-up closely in clinic. I tried to explain to him that if his BP remains untreated he will progress to dialysis. He said he has been told this for years and it hasn't happened.  We talked to the caretaker at his group home and asked them to follow his weight closely and double up on his lasix if his weight goes up 5 pounds or more. We will make him an appointment for one month and hopefully he will follow up.   Total time spent 45 minutes. Over half that time spent discussing above.   Veva Grimley,MD 4:53 PM

## 2014-10-21 ENCOUNTER — Ambulatory Visit (HOSPITAL_COMMUNITY)
Admission: RE | Admit: 2014-10-21 | Discharge: 2014-10-21 | Disposition: A | Discharge status: Home / Self Care | Payer: Medicare Other | Source: Ambulatory Visit | Attending: Cardiology | Admitting: Cardiology

## 2014-10-21 ENCOUNTER — Encounter (HOSPITAL_COMMUNITY): Payer: Self-pay

## 2014-10-21 VITALS — BP 176/85 | HR 72 | Resp 18 | Wt 239.2 lb

## 2014-10-21 DIAGNOSIS — I2510 Atherosclerotic heart disease of native coronary artery without angina pectoris: Secondary | ICD-10-CM | POA: Insufficient documentation

## 2014-10-21 DIAGNOSIS — Z720 Tobacco use: Secondary | ICD-10-CM

## 2014-10-21 DIAGNOSIS — I5032 Chronic diastolic (congestive) heart failure: Secondary | ICD-10-CM | POA: Diagnosis present

## 2014-10-21 DIAGNOSIS — N183 Chronic kidney disease, stage 3 unspecified: Secondary | ICD-10-CM

## 2014-10-21 DIAGNOSIS — N189 Chronic kidney disease, unspecified: Secondary | ICD-10-CM | POA: Diagnosis not present

## 2014-10-21 DIAGNOSIS — F259 Schizoaffective disorder, unspecified: Secondary | ICD-10-CM | POA: Diagnosis not present

## 2014-10-21 DIAGNOSIS — I10 Essential (primary) hypertension: Secondary | ICD-10-CM | POA: Diagnosis not present

## 2014-10-21 MED ORDER — CLONIDINE HCL 0.2 MG PO TABS: 0.3000 mg | ORAL_TABLET | Freq: Two times a day (BID) | ORAL | Status: AC

## 2014-10-21 MED ORDER — ASPIRIN 81 MG PO TABS: 81.0000 mg | ORAL_TABLET | Freq: Every day | ORAL | Status: AC

## 2014-10-21 NOTE — Progress Notes (Signed)
Patient ID: [name], male   DOB: Dec 05, 1960, 54 y.o.   MRN: [id number]  HPI: [name] is a 54 year old with a history of Schizoaffective disorder, DMII, CKD creatinine baseline 2.5, poorly controlled HTN, CKD, CAD says he has a stent placed in [place] and diastolic HF.  Admitted recently to [facility] with increased dyspnea. Diuresed with IV lasix and transitioned to po lasix 40 mg daily.  Discharge weight 228 pounds.    He returns for follow up. Last visit he was instructed to add hydralazine however he refused. Lives at Group Home. Smoking 1/2-1PPD. Denies SOB/PND/Orthopnea. Weight at home 237-241 pounds.  Not taking BP or checking glucometer at group home. Not taking all medications. Not on a special diet.   ECHO EF 60-65% severe LVH Grade II DD.   Labs 09/21/14 K 3.5 Creatinine 2.53   Past Medical History  Diagnosis Date  . Bipolar 1 disorder   . Schizophrenia   . Hypertension   . Diabetes mellitus without complication     Current Outpatient Prescriptions  Medication Sig Dispense Refill  . atorvastatin (LIPITOR) 10 MG tablet Take 1 tablet (10 mg total) by mouth at bedtime.    . benztropine (COGENTIN) 1 MG tablet Take 1 tablet (1 mg total) by mouth daily. 30 tablet 0  . carbamazepine (TEGRETOL) 200 MG tablet Take 1 tablet (200 mg total) by mouth 2 (two) times daily after a meal. 60 tablet 0  . cloNIDine (CATAPRES) 0.2 MG tablet Take 1 tablet (0.2 mg total) by mouth 2 (two) times daily. 60 tablet 11  . clopidogrel (PLAVIX) 75 MG tablet Take 1 tablet (75 mg total) by mouth every morning.    . ferrous sulfate 325 (65 FE) MG tablet Take 1 tablet (325 mg total) by mouth 3 (three) times daily with meals. 90 tablet 0  . furosemide (LASIX) 40 MG tablet Take 1 tablet (40 mg total) by mouth daily. 30 tablet 0  . insulin aspart (NOVOLOG FLEXPEN) 100 UNIT/ML FlexPen Inject 2-10 Units into the skin 3 (three) times daily with meals. According to Sliding Scale  If blood sugar level  is: 151-200= 2 units; 201-250= 4 units; 251-300= 6 units; 301-350= 8 units and  If 351-400= 10 units    . LORazepam (ATIVAN) 1 MG tablet Take 1 mg by mouth every 6 (six) hours as needed for anxiety (anxiety).    . metoprolol (LOPRESSOR) 50 MG tablet Take 75 mg by mouth 2 (two) times daily.    . paliperidone (INVEGA) 9 MG 24 hr tablet Take 1 tablet (9 mg total) by mouth daily. 30 tablet 0  . potassium chloride (K-DUR,KLOR-CON) 10 MEQ tablet Take 1 tablet (10 mEq total) by mouth daily.    . traZODone (DESYREL) 50 MG tablet Take 1 tablet (50 mg total) by mouth at bedtime. (Patient taking differently: Take 50 mg by mouth at bedtime as needed. Take as needed for sleep) 30 tablet 0   No current facility-administered medications for this encounter.    No Known Allergies    History   Social History  . Marital Status: Married    Spouse Name: N/A  . Number of Children: N/A  . Years of Education: N/A   Occupational History  . Not on file.   Social History Main Topics  . Smoking status: Current Every Day Smoker -- 1.00 packs/day    Types: Cigarettes  . Smokeless tobacco: Not on file  . Alcohol Use: No  . Drug Use: No  .  Sexual Activity: Not Currently   Other Topics Concern  . Not on file   Social History Narrative     No family history on file.  Filed Vitals:   10/21/14 1548  BP: 176/85  Pulse: 72  Resp: 18  Weight: 239 lb 4 oz (108.523 kg)  SpO2: 100%    PHYSICAL EXAM: General:  Well appearing. No respiratory difficulty HEENT: normal Neck: supple. JVD ~7-8 . Carotids 2+ bilat; no bruits. No lymphadenopathy or thryomegaly appreciated. Cor: PMI nondisplaced. Regular rate & rhythm. No rubs, gallops or murmurs. Lungs: clear Abdomen: soft, nontender, nondistended. No hepatosplenomegaly. No bruits or masses. Good bowel sounds. Extremities: no cyanosis, clubbing, rash, edema Neuro: alert & oriented x 3, cranial nerves grossly intact. moves all 4 extremities w/o difficulty.  Affect pleasant.   ASSESSMENT & PLAN:  1. Chronic Diastolic Heart Failure ECHO EF 60-65%  Volume status ok. Continue lasix 40 mg daily Reinforced need for fluid restriction < 2L per day. Suggested daily weights 2. Schizoaffective Disorder 3. CKD--Creatinine baseline 2.5  4. HTN- elevated, Increase clonidine to 0.3 bid.   5. CAD - [place] in the [place]. On plavix daily and statin and BB.  Add baby aspirin 6. Current smoker - Smoking 1/2 ppd  Follow up in months   Amy Clegg, NP-C  10/21/2014 3:57 PM   Patient seen with NP, agree with the above note.  BP remains high, will try to keep regimen simple and will increase clonidine to 0.3 mg bid.  I also asked him to take ASA 81 mg daily. We told him that he needs to quit smoking but I do not think he is ready to try to quit.   Marca AnconaDalton Lowell Makara 10/21/2014

## 2014-10-21 NOTE — Patient Instructions (Signed)
Follow up in 2 months  Take a baby aspirin daily  Increase clonidine to 0.3 mg twice a day   Do the following things EVERYDAY: 1) Weigh yourself in the morning before breakfast. Write it down and keep it in a log. 2) Take your medicines as prescribed 3) Eat low salt foods-Limit salt (sodium) to 2000 mg per day.  4) Stay as active as you can everyday 5) Limit all fluids for the day to less than 2 liters

## 2014-10-30 ENCOUNTER — Other Ambulatory Visit (HOSPITAL_COMMUNITY): Payer: Self-pay | Admitting: Adult Health
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
## 2014-12-21 NOTE — H&P (Signed)
PATIENT NAME:  [name], [name] MR#:  [id number] DATE OF BIRTH:  01-18-1961  DATE OF ADMISSION:  10/12/2011  CHIEF COMPLAINT AND IDENTIFYING DATA: [name] is a 54 year old single male resident of a group home with schizophrenia who has presented to the Emergency Department of [facility] with an acute change in his mental status involving psychosis as well as some unusual confusion that the group home has noticed.   [name] initially presented to the Emergency Room not allowing the intake nurse to interview him. He claimed that he did not want to be talking to any "crazy white women". He was only partially discernible regarding his speech. He was severely agitated. Initially his short-term memory recall was impaired. Also, he was having clouding of consciousness. His glucose rose to 300. Some regular insulin was given. He recovered to the point of resolving his clouding of consciousness. His orientation returned to normal. He also regained his short-term recall ability by showing an ability to recall three out of three objects immediate and three out of three objects at five minutes with the undersigned.   However, in the Emergency Department the afternoon of admission [name] continued to display intermittent looseness of associations along with psychotic projection onto the undersigned. He would engage with normal eye contact and then suddenly switch positions to  lying on his back on his gurney clearly having internal stimulation and talking to himself. He did allow the undersigned to examine him with a physical examination.   The duration of the exacerbation of his psychotic symptoms is unclear at this point, however, the organic symptoms of clouding of consciousness, confusion and memory deficits noted by the group home staff lasted for approximately 48 hours until his glucose was controlled.   There is no known acute real environmental stressors involved in the precipitation  of Mr. [name]'s mental exacerbation.   PAST PSYCHIATRIC HISTORY: [name] does have a long-term history of being diagnosed with schizophrenia. He has undergone visits to the [facility] recently on 01/02. He presented with acute psychosis after discontinuing his haloperidol decanoate several months prior. There was concern that either he had been noncompliant with his prescribed Abilify or else it had simply not been working. After being in the Emergency Room being treated with Haldol 5 mg twice a day his psychosis improved. He was able to hold a lucid conversation without any agitation or bizarre speech. He returned to his group home with ACT team follow up.   [name] has poverty of speech at this time and is showing resistance towards providing the undersigned any additional information about his past history.   FAMILY PSYCHIATRIC HISTORY: Not known at this time.   SOCIAL HISTORY: [name] has been residing in a group home. He denies any alcohol or substance abuse. He is single. Occupation-Disabled. Education-Unknown.   PAST MEDICAL HISTORY: 1. Hypertension. 2. Diabetes.  3. Osteoporosis. 4. Hypercholesterolemia.   ALLERGIES: No known drug allergies.   LABORATORY, DIAGNOSTIC AND RADIOLOGICAL DATA: Tylenol negative. Glucose is discussed as above. Initial creatinine 1.42, BUN 12. Liver enzymes normal. Total protein normal. Albumin 3.1. Ethanol negative. CBC unremarkable. Salicylates negative. TSH normal. Urine drug screen negative. Glucose after receiving insulin 110. Please see the above discussion.  REVIEW OF SYSTEMS: Constitutional, HEENT, mouth, neurologic, psychiatric, cardiovascular, respiratory, gastrointestinal, genitourinary, skin, musculoskeletal, hematologic, lymphatic, endocrine, metabolic all unremarkable.   PHYSICAL EXAMINATION:  VITAL SIGNS: Temperature 95.6, pulse 94, respiratory rate 17, blood pressure 210/125 and [facility] is acutely  managing his blood pressure.   Well-developed, well-nourished, middle-aged male lying in a supine position on his hospital gurney with no abnormal involuntary movements. He has no cachexia. His muscle tone is normal. Grooming disheveled. Hygiene normal.   Mr. [name] eye contact is poor. His concentration is mildly decreased and is clearly confounded by his internal stimulation. He is alert. Memory three out of three immediate and three out of three at five minutes. He is resistant towards detailed questions for mental status items, however, he is oriented to the year, month, day of the month, day of the week, place and person. His fund of knowledge, use of language and intelligence appear to be below his baseline. His speech is impoverished. There is no dysarthria. Thought process does involve looseness of associations intermittently. There is blocking. Thought content: He clearly is having internal stimulation. He is resistant towards specific questions but there is no evidence of thoughts of harming himself or others. He is not showing any agitation or combativeness. His affect is bizarre going from flat to closing his eyes, smiling and mumbling to himself. His mood is mildly anxious. Insight is poor. Judgment is impaired. Abstraction: He cannot participate in that assessment.   HEENT: Pupils equally round and reactive to light and accommodation. Hearing intact bilaterally to finger rub. Oropharynx clear without erythema.   NECK: Supple, nontender.   RESPIRATORY: Clear breath sounds with no wheezing, rhonchi or crackles.   CARDIOVASCULAR: Regular rate and rhythm. No murmurs, rubs, or gallops.   ABDOMEN: Bowel sounds positive, normal, soft, nontender.   GENITOURINARY: Deferred.   LYMPHATICS: No palpable nodes.   EXTREMITIES: No cyanosis, clubbing, or edema.   SKIN: Normal turgor. No rashes.   NEUROLOGICAL: Sensory intact throughout to light touch. Motor strength 5/5 throughout. Deep  tendon reflexes normal strength throughout. No Babinski. Coordination intact by heel-to-shin.   ASSESSMENT:  AXIS I: Schizophrenia, undifferentiated type, chronic with acute exacerbation.   AXIS II: Deferred.   AXIS III: See past medical history. His hypertension is being acutely treated by the general medical physician team on-call. Low-dose clonidine has been added to his standing blood pressure medication regimen that he was on prior to admission.   AXIS IV: Primary support group. The patient has likely been noncompliant with his medications.   AXIS V: 25.   Mr. Lomas exacerbation of his schizophrenia in the form of florid psychosis places him at risk for potential agitation and harming others as well as neglecting himself to the point of risk of death given the severity of his general medical problems when they are neglected.   PLAN:  1. Therefore, the undersigned will admit Mr. Cruey to the inpatient behavioral health unit for further evaluation and treatment of his psychosis.  2. [facility] is already underway with their general medical consultation to aid in the management of his general medical problems.  3. When he is capable he will participate in milieu and group psychotherapy starting with low functioning group level.  4. He will be restarted on standing haloperidol 5 mg b.i.d. He will be placed on Cogentin 0.5 mg b.i.d. for anti-EPS. Ativan 1 to 2 mg every four hours p.r.n. will be provided for anti- acute agitation.    ____________________________ Adelene Amas. Keierra Nudo, MD jsw:cms D: 10/12/2011 20:50:30 ET T: 10/13/2011 07:27:25 ET JOB#: [id number]  cc: Adelene Amas. Machai Desmith, MD, <Dictator> Lester Garland MD ELECTRONICALLY SIGNED 10/14/2011 20:02
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
## 2014-12-21 NOTE — Consult Note (Signed)
Details:    - psychiatry: This is a 54 year old man who has been in the emergency room for the past several days suffering from schizophrenia with acute psychosis. He had discontinued his haloperidol decanoate injections several months ago. He had either been noncompliant with prescribed Abilify or else it had simply not been working. Since being in the emergency room he has been treated with oral Haldol 5 mg twice a day. He has tolerated the medication well. Over the last couple days there has been a clear and significant improvement in his psychosis. On evaluation today he is able to hold a lucid conversation with me. Affect is euthymic and appropriate. Mood is stated as being okay. Thoughts appear to be generally lucid and organized. No agitation or bizarre speech. He was agreeable to continuing to take oral Haldol outside the hospital. I asked him to consider taking a Haldol decanoate shot and told him of the advantage that it would decrease the risk of relapse. The patient refuses to take the shot saying that he finds the injection too painful. At this point his group home has reevaluated him and feels comfortable with him returning. He ordered he has outpatient treatment and can be seen tomorrow by the ACTT team.his blood pressure has improved as has his blood sugar. Patient is not acutely dangeroo himself or others. He agrees to an appropriate outpatient treatment plan. He understands the risks of noncompliance with medication. He agrees to continue to get outpatient psychiatric and medical treatment. At this point it does not seem that he meets acute commitment criteria.I have discussed the case with the emergency room attending. Patient will be discharged from the emergency room to return home. I have written prescriptions for all of his current medicines for a 30 day supply with no refills.   Electronic Signatures: Audery Amellapacs, Azalia Neuberger T (MD)  (Signed 02-Jan-13 17:14)  Authored: Details   Last Updated:  02-Jan-13 17:14 by Audery Amellapacs, Cleaster Shiffer T (MD)

## 2014-12-21 NOTE — Consult Note (Signed)
PATIENT NAME:  [name], [name] MR#:  [id number] DATE OF BIRTH:  Jan 11, 1961  DATE OF CONSULTATION:  10/12/2011  REFERRING PHYSICIAN:  Antonietta Breach, MD  CONSULTING PHYSICIAN:  Elon Alas, MD  PRIMARY CARE PHYSICIAN: Unknown. The patient's states he's seen multiple doctors in the past. He was seeing a physician in [place], [place]. He was also seeing Dr. Juel Burrow in the past but states he is currently no longer seeing him and is unaware of his physician's name.   REASON FOR ADMISSION: Uncontrolled hypertension.   HISTORY OF PRESENT ILLNESS: The patient is a 54 year old male with past medical history of hypertension, diabetes mellitus, schizophrenia, gastroesophageal reflux disease, and renal insufficiency who was admitted under the psychiatric service earlier today for agitation and for psychiatric stabilization of his schizophrenia. He is currently admitted to the Behavioral Medicine Unit. Medical consultation was requested to assist with uncontrolled hypertension. The patient's blood pressure was noted to be elevated today at 233/126 and later 224/120. He is currently on Norvasc, enalapril, and HCTZ all of which he has received and taken today. Despite taking these medications, his blood pressure remains persistently uncontrolled for which medical consultation was requested. Per the nurse, the patient has been quite agitated. On my exam the patient is currently non-agitated and he denies any specific complaints at this time. He states that he has been compliant with his medications at home but cannot tell me the name of the physician who has been prescribing medications.   PAST SURGICAL HISTORY: No past surgeries.   PAST MEDICAL HISTORY:  1. Hypertension. 2. Type II diabetes mellitus. 3. Schizophrenia. 4. Gastroesophageal reflux disease. 5. Renal insufficiency. The patient was seen by the hospitalist team during an admission in December 2012 and was noted to have abnormal renal function  at that time as well. He may have underlying CKD.   ALLERGIES: No known drug allergies.  HOME MEDICATIONS: The patient is unaware.   CURRENT INPATIENT MEDICATIONS: 1. Tylenol p.r.n.  2. Antacid double-strength suspension p.r.n.  3. Norvasc 5 mg daily. 4. Cogentin 0.5 mg b.i.d.  5. Enalapril 10 mg daily.  6. Haldol 5 mg p.o. q.12 hours. 7. HCTZ 25 mg daily.  8. Milk of Magnesia p.r.n.  9. Metformin 1000 mg b.i.d.  10. Actos 30 mg daily.  11. Zocor 10 mg at bedtime.   FAMILY HISTORY: Positive for high blood pressure and diabetes mellitus in the family per the patient.   SOCIAL HISTORY: The patient states that he used to drink alcohol and also smoked tobacco in the past but states he is currently not using either of these agents at this time. Denies illicit drug use. He is a resident of [facility] group home.   REVIEW OF SYSTEMS: CONSTITUTIONAL: Denies fevers, chills, or recent changes in weight, weakness, or any pain at the moment. HEAD/EYES: Denies headache or blurry vision. ENT: Denies tinnitus, earache, nasal discharge, or sore throat. RESPIRATORY: Denies shortness breath, cough, or wheezing. CARDIOVASCULAR: Denies chest pain, heart palpitations, lower extremity edema. GI: Denies nausea, vomiting, diarrhea, constipation, melena, hematochezia, abdominal pain. GU: Denies dysuria or hematuria. ENDOCRINE: Denies heat or cold intolerance. HEME/LYMPH: Denies easy bruising or bleeding. INTEGUMENTARY: Denies rash. MUSCULOSKELETAL: Denies joint pain or muscle weakness. NEUROLOGIC: Denies headache, numbness, weakness, tingling, or dysarthria. PSYCHIATRIC: Denies depression or anxiety.    PHYSICAL EXAMINATION:   VITAL SIGNS: Temperature 95.6, pulse 94, blood pressure 233/126, respirations 17.   GENERAL: The patient is alert and oriented not acutely distressed.   HEENT: Normocephalic,  atraumatic. Pupils equal, round, and reactive to light and accommodation. Extraocular muscles are intact.  Anicteric sclerae. Conjunctivae pink. Hearing intact to voice. Nares without drainage. Oral mucous moist without lesions.    NECK: Supple with full range of motion. No JVD, lymphadenopathy, or carotid bruits bilaterally. No thyromegaly or tenderness to palpation over the thyroid gland.   CHEST: Normal respiratory effort without use of accessory respiratory muscles.    LUNGS: Clear to auscultation bilaterally without crackles, rales, or wheezing.   CARDIOVASCULAR: S1, S2 positive. Regular rate and rhythm. No murmurs, rubs, or gallops. PMI is non-lateralized.   ABDOMEN: Soft, nontender, nondistended. Normoactive bowel sounds. No hepatosplenomegaly or palpable masses. No hernias.   EXTREMITIES: No clubbing, cyanosis, or edema. Pedal pulses are palpable bilaterally.   SKIN: No suspicious rash.   LYMPH: No cervical lymphadenopathy.  NEUROLOGIC: Alert and oriented x3. Cranial nerves II through XII are grossly intact. No focal deficits.  PSYCH: Somewhat of a flattened affect but he is currently non-agitated.  LABORATORY, DIAGNOSTIC, AND RADIOLOGICAL DATA: Serum glucose was 278 on arrival. Most recent blood glucose is 110 with point of care testing. Urine drug screen negative. Serum acetaminophen less than 2. CMP within normal limits on admission except for creatinine of 1.42, serum glucose 278, serum albumin 3.1, estimated GFR 56. CBC normal on admission. TSH 1.2 on admission. Serum salicylate within normal limits on admission at 2.8. Serum alcohol less than 0.003% on admission. Most recent creatinine is 1.46 from 08/25/2011.   ASSESSMENT AND PLAN: This is a 54 year old male with past medical history of hypertension, diabetes mellitus, schizophrenia, gastroesophageal reflux disease, and renal insufficiency which is probably chronic here with psychosis and agitation admitted to Behavioral Medicine Unit for schizophrenia management with:  1. Malignant hypertension. Will initiate gradual blood  pressure reduction. Earlier I gave an order to have the patient receive clonidine and also increase his Norvasc dose which has led to improvement of his blood pressure down to a systolic range of 160 which is currently his goal for now and over the next 24 hours we will continue to gradually reduce his blood pressure. He is currently on Norvasc, enalapril, and HCTZ. We have doubled and maxed out the Norvasc dose. We still have room to up titrate his enalapril and will increase this from 10 to 20 mg per day. Hopefully after increasing both Norvasc and enalapril blood pressure will be better controlled and he will also continue HCTZ. Will also write for p.r.n. oral clonidine (he responded well to a dose of this given earlier). Would also give anxiolytic of choice for agitation which could also be contributing to his elevated blood pressure and will defer this to Psychiatry. If the patient's blood pressure remains persistently uncontrolled despite the above-mentioned measures, will need to add additional oral agents versus consider p.r.n. IV antihypertensives. Will recommend monitoring the patient's blood pressure closely as well.   2. Renal insufficiency, suspect underlying CKD. Renal function was abnormal in the recent past as well. Most recent creatinine in our system was 1.46 from 08/25/2011, currently 1.42 and appears to be stable. Suspect he may have underlying CKD possibly due to hypertension and diabetes. He does have slightly low serum albumin level and will check a urinalysis to assess for proteinuria. Recommend referral to Nephrology as an outpatient and for now would avoid nephrotoxins and use ACE inhibitor cautiously and since his renal function has remained stable will continue enalapril therapy at this time. Recommend continuing to monitor the patient's renal function closely  while in the hospital. He is currently nonoliguric.  3. Type II diabetes mellitus. Would use metformin cautiously given the  patient's impaired renal function and associated risk of lactic acidosis. Could consider an alternative agent such as glipizide. Will need to decide on this prior to discharge. Will continue Actos for now as well. Continue frequent Accu-Cheks. Check hemoglobin A1c. Since the patient is hypertensive and diabetic, recommend low dose daily aspirin for stroke and MI prophylaxis.  4. Gastroesophageal reflux disease, currently stable. The patient denies any indigestion, heartburn, abdominal pain, nausea or vomiting. He is receiving p.r.n. antacids.  5. Schizophrenia, being managed by Dr. Jeanie Sewer.  6. DVT prophylaxis. The patient is ambulatory.   CODE STATUS: FULL CODE.   Thank you for this consultation and for allowing me to participate in Mr. Petrelli care. Hospitalist service will continue to follow for now.      TIME SPENT ON THIS CONSULTATION: Approximately 40 minutes.   ____________________________ Elon Alas, MD knl:drc D: 10/12/2011 20:47:52 ET T: 10/13/2011 09:58:12 ET JOB#: [id number]  cc: Elon Alas, MD, <Dictator> Adelene Amas. [place], MD Elon Alas MD ELECTRONICALLY SIGNED 11/02/2011 21:06

## 2014-12-21 NOTE — Consult Note (Signed)
Brief Consult Note: Diagnosis: uncontrolled htn.   Patient was seen by consultant.   Consult note dictated.   Recommend further assessment or treatment.   Orders entered.   Discussed with Attending MD.   Comments: *malignant htn - pt currently on norvasc, enalapril, hctz.  have room to uptitrate norvasc and enalapril.  will increase both.  needs gradual bp reduction and would not lower too rapidly.  also write for prn PO clonidine (responded well to a dose given earlier).  would also give anxiolytic of choice for agitation (will defer to psych) which could be contributing to elevated bp as well.  if bp remains persistently uncontrolled will need to add additional agents vs consider prn IV agents.  will cont. to follow.  *renal insufficiency - suspect CKD.  renal fxn abnormal in recent past most recent Cr was 1.46 from 08/25/11, currently 1.42.  ?CKD due to htn and DM.  recommend referral to nephro as outpatient, avoid nephrotoxins, use ACE-I cautiously, monitor renal fxn while in hospital.  pt is non-oliguric.  *DM-II - would use metformin cautiously given impaired renal fxn and associated risk of lactic acidosis.  consider alternative agent such as glipizide.  cont actos.  cont accuchecks.  check A1c.  *schizophrenia - being managed by Dr. Jeanie SewerWilliford  thanks for the consult, will continue to follow.  Electronic Signatures: Camillo FlamingLateef, Arnel Wymer (MD)  (Signed 13-Feb-13 20:25)  Authored: Brief Consult Note   Last Updated: 13-Feb-13 20:25 by Camillo FlamingLateef, Steen Bisig (MD)
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
## 2014-12-21 NOTE — Consult Note (Signed)
PATIENT NAME:  [name], [name] MR#:  [id number] DATE OF BIRTH:  [birth year]  DATE OF CONSULTATION:  08/26/2011  REFERRING PHYSICIAN:  Mordecai Rasmussen, MD  CONSULTING PHYSICIAN:  Krystal Eaton, MD  REASON FOR CONSULTATION: Elevated blood pressure.  HISTORY OF PRESENT ILLNESS: The patient is a 54 year old African American male with history of schizophrenia, paranoia, gastroesophageal reflux disease, hypertension, and diabetes was brought in yesterday per records for paranoia, psychosis, and has been involuntary committed and is awaiting placement and admission to [facility] per Psychiatry. Medicine was consulted for blood pressure elevation despite resumption of his blood pressure medications. At this point, the patient has agitation, psychosis, and is unable to provide any reliable information. He paces throughout the interview and mumbles his words, however, states that he has been taking the medications given him here. So far he has received HCTZ 25 mg x1, clonidine 0.2 mg x1, and just received 0.1 mg x1 within the previous our and a half as well as Norvasc 5 mg x2 as well as Cozaar 100 mg. The last blood pressure was 212/112. There is conflicting information about whether he has taken his medications. He is uncooperative with the interview process and exam.   PAST MEDICAL HISTORY: 1. High blood pressure. 2. Diabetes. 3. Schizophrenia. 4. Gastroesophageal reflux disease.  MEDICATIONS PER CHART:  1. Haldol. 2. Hydrochlorothiazide 25 mg daily.  3. Cozaar 100 mg daily.  4. Glucophage 1000 mg b.i.d.  5. Actos 30 mg daily.   ALLERGIES: No known drug allergies per chart.   SOCIAL HISTORY: He states he never drank alcohol or smoked tobacco. He lives in a group home.   FAMILY HISTORY: Unable to obtain.  REVIEW OF SYSTEMS: Unable to obtain.   PHYSICAL EXAMINATION:   VITAL SIGNS: Temperature this morning 96.7. Blood pressure has continued to be elevated since yesterday afternoon  routinely in the low 200's. The lowest blood pressure today has been 184/103 and last blood pressure was 212/112, pulse 80, respiratory rate 20, oxygen sat 99% on room air.  I was able to do a cursory physical examination secondary to the patient being noncooperative.   GENERAL: The patient is an obese [place] American male who appears in no obvious distress ambulating in the hallway.   HEENT: Appears normocephalic, atraumatic. No thyroid tenderness.   CARDIOVASCULAR: S1, S2. No murmurs, rubs, or gallops.   LUNGS: Clear to auscultation.   ABDOMEN: No significant tenderness. No rebound or guarding.   EXTREMITIES: No significant edema.   NEUROLOGIC: Unable to do cranial nerves, however, moves all extremities spontaneously, ambulating in the hallway.   LABORATORY, DIAGNOSTIC, AND RADIOLOGICAL DATA: Creatinine 1.46, potassium 3.1, glucose 174. CK total initially was 1589, currently 166. CK-MB initially 8.9, currently 1.5. Troponin negative x1. TSH 0.53. Urine toxicology negative. CBC essentially normal. No nitrates or leukocyte esterase on the urinalysis. CT of the head negative for any acute intracranial pathology. Chest x-ray is negative for any acute cardiopulmonary disease.   ASSESSMENT AND PLAN: This is a 54 year old African American male who has been involuntary committed for psychosis and schizophrenia with elevated blood pressure, hypokalemia, and an elevated creatinine. He is on the [facility] side awaiting admission to [facility] as he is too unstable psychiatrically here for our [facility]. At this point, I would recommend starting labetalol IV p.r.n. if the clonidine which was recently given does not work. We have to bring down the blood pressure to 150's systolic. He has received multiple doses of p.o. medications without  success so far. I have discussed with Dr. Toni Amendlapacs and he recommended giving another 2 mg of Ativan for better cooperation and then we would give the  labetalol IV a try. As he has not received any potassium today, I would also give a dose of potassium here. In terms of his renal function, his creatinine currently is 1.46 with GFR of greater than 60, however, there are no other labs in the last couple of years so I don't know if this is acute or chronic. This can be trended. In regards to his blood glucose, I would continue his p.o. medicines for now.   I've discussed the case with Dr. Mordecai RasmussenJohn Clapacs.   TOTAL TIME SPENT: 45 minutes.   ____________________________ Krystal EatonShayiq Jamina Macbeth, MD sa:drc D: 08/26/2011 17:50:40 ET T: 08/27/2011 10:36:49 ET JOB#: [id number]  cc: Krystal EatonShayiq Lars Jeziorski, MD, <Dictator> Krystal EatonSHAYIQ Jakaiya Netherland MD ELECTRONICALLY SIGNED 09/09/2011 20:36

## 2016-04-29 DEATH — deceased

## 2016-09-20 IMAGING — CR DG CHEST 1V PORT
1 series · 1 of 1 positions shown · non-contrast
Comparison: None.

ADDENDUM:
These results were called by telephone at the time of interpretation
on 09/19/2014 at [DATE] to Dr. NIVIRUS DATABEX , who verbally
acknowledged these results.
CLINICAL DATA: Patient with respiratory distress. History of
hypertension and diabetes. Nonsmoker.

EXAM:
PORTABLE CHEST - 1 VIEW

[AP]
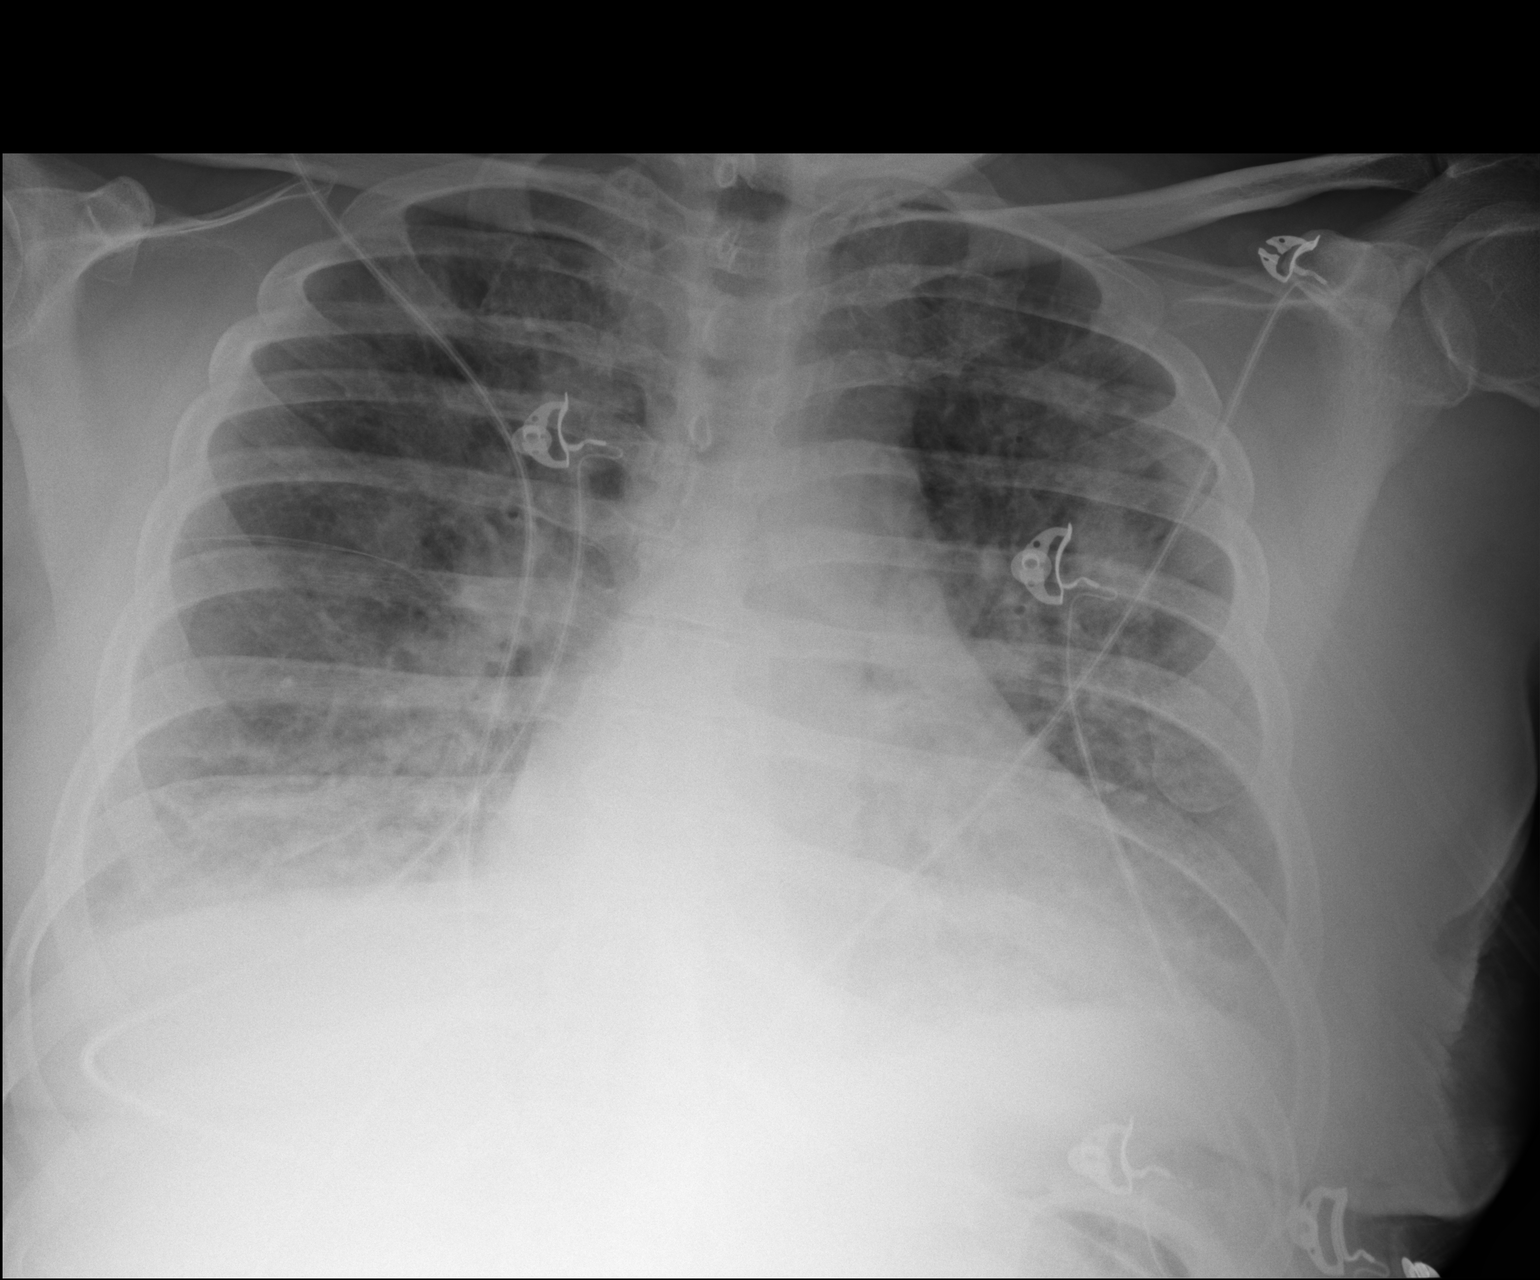

[1 of 1 positions shown; findings below may reference images not displayed]

FINDINGS: Cardiac and mediastinal contours upper limits of normal. Diffuse
bilateral heterogeneous pulmonary opacities are demonstrated. More
focal consolidative opacity demonstrated within the left upper lung.
Possible tiny bilateral pleural effusions. No pneumothorax.
Monitoring leads overlie the patient.
IMPRESSION: Diffuse bilateral heterogeneous pulmonary opacities may represent
pulmonary edema or potentially multi focal infection. More focal
consolidative opacity within the left upper lung may be related to
above described acute process however underlying mass is a
consideration. Recommend short-term followup to ensure resolution.
If this persists, chest CT would be warranted.

Possible tiny bilateral pleural effusions.

## 2016-09-21 IMAGING — CR DG CHEST 2V
2 series · 2 of 2 positions shown · non-contrast
Comparison: 09/19/2014

CLINICAL DATA: Shortness of breath.  Nonproductive cough.

EXAM:
CHEST  2 VIEW

[chest pa]
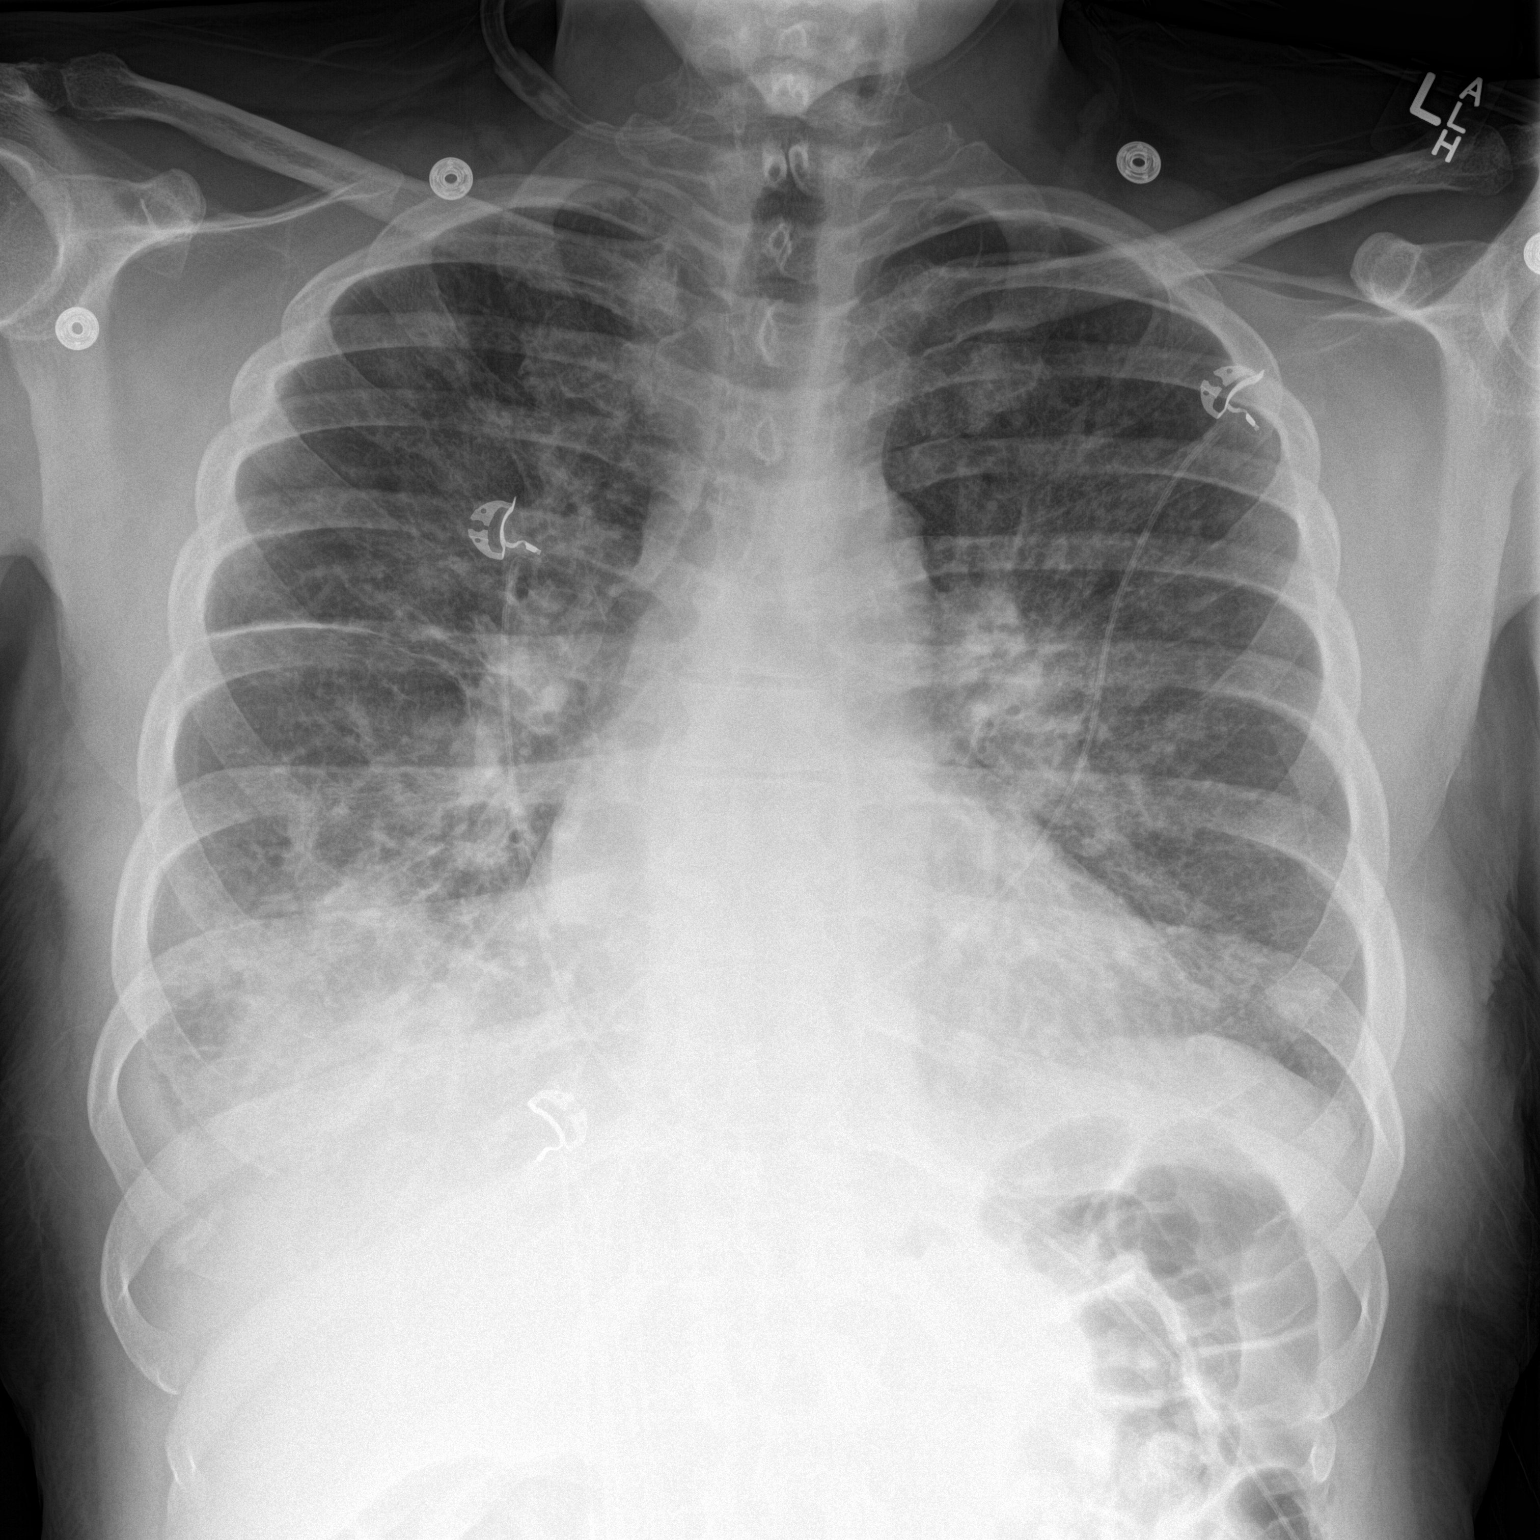

[chest lat]
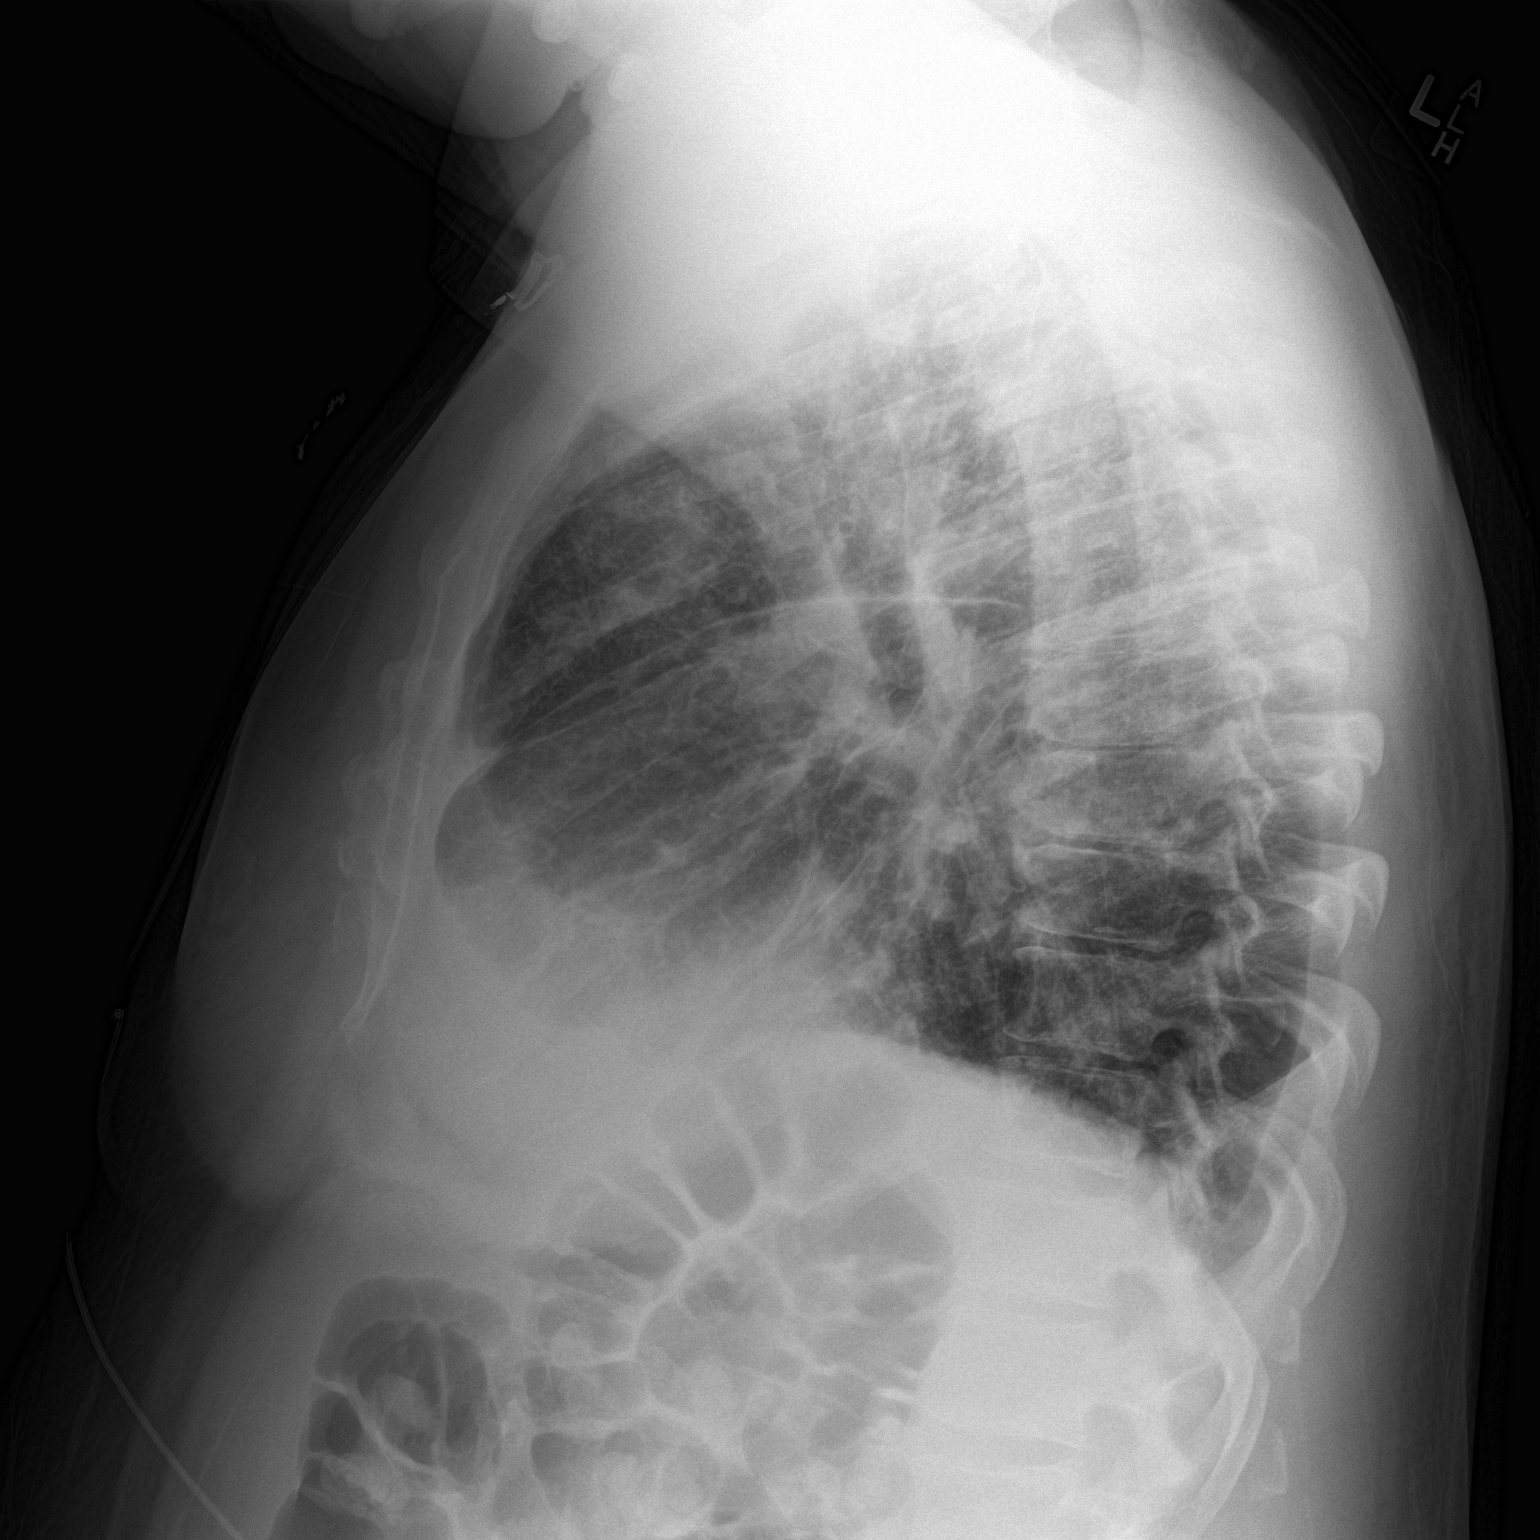

[2 of 2 positions shown; findings below may reference images not displayed]

FINDINGS: Increasing right basilar airspace opacity. Continued bilateral
interstitial opacities and perihilar airspace opacities with
associated airway thickening. Somewhat the nodular left perihilar
opacity is reduced. Trace blunting of both posterior costophrenic
angles. Thoracic spondylosis. Small amount of fluid in the minor
fissure. Kerley B-lines noted. Mild cardiomegaly.
IMPRESSION: 1. Mild cardiomegaly with bilateral airway thickening, interstitial
accentuation, mild perihilar airspace opacities, and confluent right
basilar airspace opacity. Small bilateral pleural effusions.
Appearance favors acute pulmonary edema, although the possibility of
superimposed right basilar pneumonia or atypical pneumonia are not
completely discarded.
